# Patient Record
Sex: Female | Born: 1970 | Race: White | Hispanic: No | Smoking: Never smoker
Health system: Southern US, Community
[De-identification: ages and names within clinical notes are randomized; demographics above are authoritative.]

## PROBLEM LIST (undated history)

## (undated) ENCOUNTER — Ambulatory Visit: Admission: EM | Payer: BC Managed Care – PPO

## (undated) DIAGNOSIS — F419 Anxiety disorder, unspecified: Secondary | ICD-10-CM

## (undated) DIAGNOSIS — Z87442 Personal history of urinary calculi: Secondary | ICD-10-CM

## (undated) DIAGNOSIS — Z87440 Personal history of urinary (tract) infections: Secondary | ICD-10-CM

## (undated) DIAGNOSIS — IMO0002 Reserved for concepts with insufficient information to code with codable children: Secondary | ICD-10-CM

## (undated) DIAGNOSIS — M549 Dorsalgia, unspecified: Secondary | ICD-10-CM

## (undated) DIAGNOSIS — C801 Malignant (primary) neoplasm, unspecified: Secondary | ICD-10-CM

## (undated) DIAGNOSIS — M1990 Unspecified osteoarthritis, unspecified site: Secondary | ICD-10-CM

## (undated) DIAGNOSIS — Z8659 Personal history of other mental and behavioral disorders: Secondary | ICD-10-CM

## (undated) DIAGNOSIS — D649 Anemia, unspecified: Secondary | ICD-10-CM

## (undated) DIAGNOSIS — K649 Unspecified hemorrhoids: Secondary | ICD-10-CM

## (undated) HISTORY — DX: Malignant (primary) neoplasm, unspecified: C80.1

## (undated) HISTORY — DX: Unspecified hemorrhoids: K64.9

## (undated) HISTORY — DX: Anemia, unspecified: D64.9

## (undated) HISTORY — DX: Personal history of other mental and behavioral disorders: Z86.59

## (undated) HISTORY — DX: Anxiety disorder, unspecified: F41.9

## (undated) HISTORY — DX: Personal history of urinary (tract) infections: Z87.440

## (undated) HISTORY — DX: Dorsalgia, unspecified: M54.9

## (undated) HISTORY — DX: Unspecified osteoarthritis, unspecified site: M19.90

## (undated) HISTORY — DX: Reserved for concepts with insufficient information to code with codable children: IMO0002

## (undated) HISTORY — DX: Personal history of urinary calculi: Z87.442

---

## 1999-06-06 HISTORY — PX: FOOT SURGERY: SHX648

## 2000-06-05 DIAGNOSIS — C801 Malignant (primary) neoplasm, unspecified: Secondary | ICD-10-CM

## 2000-06-05 HISTORY — DX: Malignant (primary) neoplasm, unspecified: C80.1

## 2011-07-12 ENCOUNTER — Ambulatory Visit: Payer: BC Managed Care – PPO | Admitting: Family Medicine

## 2011-07-12 DIAGNOSIS — R079 Chest pain, unspecified: Secondary | ICD-10-CM

## 2011-07-12 NOTE — Progress Notes (Signed)
  Subjective:    Patient ID: Adrienne Reese, female    DOB: 1971-06-01, 41 y.o.   MRN: [id number]  HPI 41 yo female with c/o chest pain.  Left side, since yesterday.  "moves around".  Some pain in left upper arm.  Today went down into forearm and hand felt weak.  Can't pinpoint when it started yesterday, while at work.  Comes and goes.  No aggravating or relieving factors.  Lasts a few seconds - "maybe 20".  Pain is sharp, "it just hurts", feels like in her ribs.  Never had before.  Recent URI - still with some congestion.  For several weeks.  No n  No family history of heart probs, no personal history of hearth probs.  Good cholesterol.  Generally healthy.    Review of Systems Negative except as per HPI     Objective:   Physical Exam  Constitutional: She appears well-developed and well-nourished.  HENT:  Head: Normocephalic.  Mouth/Throat: Oropharynx is clear and moist.  Cardiovascular: Normal rate, regular rhythm, normal heart sounds and intact distal pulses.   Pulmonary/Chest: Effort normal and breath sounds normal.     EKG - normal     Assessment & Plan:  Atypical chest pain - normal EKG.  No risk factors.  Reassurance.  Monitor.  Red flag symptoms discussed and advised to seek immediate care.

## 2011-12-12 ENCOUNTER — Ambulatory Visit (INDEPENDENT_AMBULATORY_CARE_PROVIDER_SITE_OTHER): Payer: BC Managed Care – PPO | Admitting: Internal Medicine

## 2011-12-12 ENCOUNTER — Ambulatory Visit: Payer: BC Managed Care – PPO

## 2011-12-12 VITALS — BP 122/78 | HR 79 | Temp 98.1°F | Resp 16 | Ht 68.0 in | Wt 166.0 lb

## 2011-12-12 DIAGNOSIS — M25539 Pain in unspecified wrist: Secondary | ICD-10-CM

## 2011-12-12 DIAGNOSIS — M25531 Pain in right wrist: Secondary | ICD-10-CM

## 2011-12-12 DIAGNOSIS — M6740 Ganglion, unspecified site: Secondary | ICD-10-CM

## 2011-12-12 MED ORDER — IBUPROFEN 600 MG PO TABS: 600.0000 mg | ORAL_TABLET | Freq: Three times a day (TID) | ORAL | Status: AC | PRN

## 2011-12-12 NOTE — Progress Notes (Signed)
  Subjective:    Patient ID: Adrienne Reese, female    DOB: Nov 10, 1970, 41 y.o.   MRN: [id number]  HPI Wrist pain for 1-2 months, no injury. Has tender lump over snuff box. Pain with full ext/flex.   Review of Systems healthy    Objective:   Physical Exam R wrist full rom, nmv intact, ganglon over snuff box and tender  [facility] reading (PRIMARY) by  Dr.Teneka Malmberg normal wrist xr.         Assessment & Plan:  Wrist pain cause unclear Ganglion cyst May need ortho referral. Splint,rice, ibuprofen

## 2011-12-12 NOTE — Patient Instructions (Addendum)
Carpal Tunnel Syndrome You may have carpal tunnel syndrome. This is a common condition. Carpal tunnel syndrome occurs when the tendons, bones, or ligaments in the wrist press against the median nerve as it passes into the hand.  Symptoms can include:  Intermittent numbness.   Pain or a tingling sensation in thumb and first two fingers.  The pain may radiate up to the shoulder. There may even be weakness in the hand muscles. The pain is often worse at night and in the early morning. Nerve conduction tests may be used to prove the diagnosis. Carpal tunnel syndrome is most often due to repeated movements of the hand or wrist. Other causes can include:  Prior injuries.   Diabetes.   Obesity.   Smoking.   Pregnancy. Symptoms that develop during pregnancy often stop when the pregnancy is over.  Treatment includes:  Splinting - A wrist splint helps prevent movements that irritate the nerve. Splints are especially helpful at night when the symptoms are often worse.   Ice packs - Cold packs applied to the palm side of the wrist for 20 minutes every 2 hours while awake may give some relief.   Medication - Medicine to reduce inflammation and pain are often used. Cortisone injections around the nerve may also bring improvement.  Severe cases of carpal tunnel syndrome can require surgery to relieve the pressure on the nerve. This may be necessary if there is evidence of weakness or decreased sensation in your hand, or if your symptoms do not improve with conservative treatment. See your caregiver for follow-up to be certain your condition is improving. Document Released: 06/29/2004 Document Revised: 02/01/2011 Document Reviewed: 03/28/2007 [employer] Patient Information 2012 [employer]gaGanglion A ganglion is a swelling under the skin that is filled with a thick, jelly-like substance. It is a synovial cyst. This is caused by a break (rupture) of the joint lining from the joint space. A ganglion  often occurs near an area of repeated minor trauma (damage caused by an accident). Trauma may also be a repetitive movement at work or in a sport. TREATMENT  It often goes away without treatment. It may reappear later. Sometimes a ganglion may need to be surgically removed. Often they are drained and injected with a steroid. Sometimes they respond to:  Rest.   Splinting.  HOME CARE INSTRUCTIONS   Your caregiver will decide the best way of treating your ganglion. Do not try to break the ganglion yourself by pressing on it, poking it with a needle, or hitting it with a heavy object.   Use medications as directed.  SEEK MEDICAL CARE IF:   The ganglion becomes larger or more painful.   You have increased redness or swelling.   You have weakness or numbness in your hand or wrist.  MAKE SURE YOU:   Understand these instructions.   Will watch your condition.   Will get help right away if you are not doing well or get worse.  Document Released: 05/19/2000 Document Revised: 05/11/2011 Document Reviewed: 07/16/2007 [facility] Patient Information 2012 [place], [place].

## 2012-01-28 ENCOUNTER — Ambulatory Visit (INDEPENDENT_AMBULATORY_CARE_PROVIDER_SITE_OTHER): Payer: BC Managed Care – PPO | Admitting: Family Medicine

## 2012-01-28 ENCOUNTER — Ambulatory Visit: Payer: BC Managed Care – PPO

## 2012-01-28 VITALS — BP 127/74 | HR 65 | Temp 98.2°F | Resp 16 | Ht 68.0 in | Wt 169.0 lb

## 2012-01-28 DIAGNOSIS — M542 Cervicalgia: Secondary | ICD-10-CM

## 2012-01-28 DIAGNOSIS — M549 Dorsalgia, unspecified: Secondary | ICD-10-CM

## 2012-01-28 MED ORDER — METHYLPREDNISOLONE 4 MG PO KIT: PACK | ORAL | Status: AC

## 2012-01-28 MED ORDER — MELOXICAM 7.5 MG PO TABS: 7.5000 mg | ORAL_TABLET | Freq: Every day | ORAL | Status: DC
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
## 2012-01-31 ENCOUNTER — Telehealth: Payer: Self-pay

## 2012-01-31 ENCOUNTER — Other Ambulatory Visit: Payer: Self-pay | Admitting: Family Medicine

## 2012-01-31 DIAGNOSIS — M549 Dorsalgia, unspecified: Secondary | ICD-10-CM

## 2012-01-31 MED ORDER — METHOCARBAMOL 500 MG PO TABS: 500.0000 mg | ORAL_TABLET | Freq: Two times a day (BID) | ORAL | Status: AC | PRN

## 2012-01-31 NOTE — Telephone Encounter (Signed)
Spoke with patient will try msk relaxer. If no improvement in 24-48 hrs then will call me and we can refer her out

## 2012-01-31 NOTE — Telephone Encounter (Signed)
The patient called to report that the medrol and mobic are providing little to no relief from the sharp shooting pain she is feeling.  Please call the patient at [phone] to discuss the next steps.

## 2012-01-31 NOTE — Telephone Encounter (Signed)
I have called patient back and she is c/o neck pain into her upper/ mid back usually within a couple days of starting prednisone she is better. She can not turn head or raise arm without pain. Please advise next step and I will call patient

## 2012-02-10 ENCOUNTER — Telehealth: Payer: Self-pay

## 2012-02-10 NOTE — Telephone Encounter (Signed)
Pt states that her back pain is beginning to get worse. Best# [phone] Pharmacy: [facility].

## 2012-02-11 ENCOUNTER — Other Ambulatory Visit: Payer: Self-pay | Admitting: Family Medicine

## 2012-02-11 DIAGNOSIS — M542 Cervicalgia: Secondary | ICD-10-CM

## 2012-02-11 MED ORDER — HYDROCODONE-ACETAMINOPHEN 5-500 MG PO TABS: 1.0000 | ORAL_TABLET | Freq: Three times a day (TID) | ORAL | Status: AC | PRN

## 2012-02-11 MED ORDER — MELOXICAM 7.5 MG PO TABS: 7.5000 mg | ORAL_TABLET | Freq: Two times a day (BID) | ORAL | Status: DC

## 2012-02-11 NOTE — Telephone Encounter (Signed)
SPOKE WITH PATIENT, IF NO IMPROVEMENT WITH MEDICATION CHANGES THEN WILL SEND TO PT

## 2012-07-24 ENCOUNTER — Ambulatory Visit (INDEPENDENT_AMBULATORY_CARE_PROVIDER_SITE_OTHER): Payer: BC Managed Care – PPO | Admitting: Family Medicine

## 2012-07-24 ENCOUNTER — Other Ambulatory Visit: Payer: Self-pay | Admitting: Family Medicine

## 2012-07-24 ENCOUNTER — Encounter: Payer: Self-pay | Admitting: Family Medicine

## 2012-07-24 VITALS — BP 106/68 | HR 71 | Temp 98.5°F | Resp 16 | Ht 67.0 in | Wt 165.6 lb

## 2012-07-24 DIAGNOSIS — R5381 Other malaise: Secondary | ICD-10-CM

## 2012-07-24 DIAGNOSIS — Z0000 Encounter for general adult medical examination without abnormal findings: Secondary | ICD-10-CM

## 2012-07-24 DIAGNOSIS — M5030 Other cervical disc degeneration, unspecified cervical region: Secondary | ICD-10-CM

## 2012-07-24 DIAGNOSIS — R5383 Other fatigue: Secondary | ICD-10-CM

## 2012-07-24 DIAGNOSIS — Z803 Family history of malignant neoplasm of breast: Secondary | ICD-10-CM

## 2012-07-24 LAB — CBC WITH DIFFERENTIAL/PLATELET
Basophils Absolute: 0 10*3/uL (ref 0.0–0.1)
Basophils Relative: 0 % (ref 0–1)
Eosinophils Absolute: 0.1 10*3/uL (ref 0.0–0.7)
HCT: 40.9 % (ref 36.0–46.0)
MCH: 29.9 pg (ref 26.0–34.0)
MCHC: 35 g/dL (ref 30.0–36.0)
Monocytes Absolute: 0.5 10*3/uL (ref 0.1–1.0)
Monocytes Relative: 7 % (ref 3–12)
Neutro Abs: 3.6 10*3/uL (ref 1.7–7.7)
RDW: 13 % (ref 11.5–15.5)

## 2012-07-24 LAB — COMPREHENSIVE METABOLIC PANEL
AST: 16 U/L (ref 0–37)
Albumin: 4.7 g/dL (ref 3.5–5.2)
Alkaline Phosphatase: 67 U/L (ref 39–117)
BUN: 11 mg/dL (ref 6–23)
Creat: 0.82 mg/dL (ref 0.50–1.10)
Glucose, Bld: 99 mg/dL (ref 70–99)
Potassium: 3.9 mEq/L (ref 3.5–5.3)
Total Bilirubin: 0.7 mg/dL (ref 0.3–1.2)

## 2012-07-24 NOTE — Progress Notes (Signed)
  Subjective:    Patient ID: Adrienne Reese, female    DOB: 02-04-1971, 42 y.o.   MRN: [id number]  HPI  This 42 y.o. Cauc female is here for CPE (GYN care provided elsewhere). She was evaluated   at [facility] several months ago for neck pain w/ radicular symptoms (see note per Dr. Conley Rolls dated  01/26/12- past hx well documented). CHronic pain now w/ no relief w/ conservative treatment  measures. Pt desires further evaluation.   Review of Systems  Constitutional: Positive for diaphoresis and unexpected weight change.  HENT: Positive for neck pain and neck stiffness.   Eyes: Negative.   Respiratory: Negative.   Cardiovascular: Negative.   Gastrointestinal: Negative.   Endocrine: Negative.   Genitourinary: Positive for menstrual problem.  Musculoskeletal: Positive for back pain.  Skin: Negative.   Allergic/Immunologic: Negative.   Neurological: Negative.   Hematological: Negative.   Psychiatric/Behavioral: Negative.        Objective:   Physical Exam  Nursing note and vitals reviewed. Constitutional: She is oriented to person, place, and time. Vital signs are normal. She appears well-developed and well-nourished. No distress.  HENT:  Head: Normocephalic and atraumatic.  Right Ear: Hearing, tympanic membrane, external ear and ear canal normal.  Left Ear: Hearing, tympanic membrane, external ear and ear canal normal.  Nose: Nose normal. No nasal deformity or septal deviation.  Mouth/Throat: Uvula is midline, oropharynx is clear and moist and mucous membranes are normal. No oral lesions. Normal dentition. No dental caries.  Eyes: Conjunctivae, EOM and lids are normal. Pupils are equal, round, and reactive to light. No scleral icterus.  Fundoscopic exam:      The right eye shows no arteriolar narrowing, no AV nicking and no papilledema. The right eye shows red reflex.       The left eye shows no arteriolar narrowing, no AV nicking and no papilledema. The left eye shows red reflex.  Neck:  Trachea normal. Neck supple. Muscular tenderness present. No spinous process tenderness present. No rigidity. No mass and no thyromegaly present.  Cardiovascular: Normal rate, regular rhythm, normal heart sounds and intact distal pulses.  Exam reveals no gallop and no friction rub.   No murmur heard. Pulmonary/Chest: Effort normal and breath sounds normal. No respiratory distress. She exhibits no tenderness.  Abdominal: Soft. Normal appearance and bowel sounds are normal. She exhibits no distension and no mass. There is no hepatosplenomegaly. There is no tenderness. There is no guarding and no CVA tenderness.  Genitourinary:  Exam deferred- performed by GYN.  Musculoskeletal: Normal range of motion. She exhibits no edema and no tenderness.  Lymphadenopathy:    She has no cervical adenopathy.  Neurological: She is alert and oriented to person, place, and time. She has normal reflexes. She displays normal reflexes. No cranial nerve deficit. She exhibits normal muscle tone. Coordination normal.  Skin: Skin is warm and dry. No rash noted. No erythema. No pallor.  Psychiatric: She has a normal mood and affect. Her behavior is normal. Judgment and thought content normal.          Assessment & Plan:  Routine general medical examination at a health care facility- overall general health is good.  DDD (degenerative disc disease), cervical - Plan: MR Cervical Spine Wo Contrast  Other malaise and fatigue - Plan: Vitamin D, 25-hydroxy, CBC with Differential, Comprehensive metabolic panel, TSH  Family history of breast cancer in mother - Plan: MM Digital Screening

## 2012-07-24 NOTE — Patient Instructions (Addendum)
Keeping You Healthy  Get These Tests 1. Blood Pressure- Have your blood pressure checked once a year by your health care provider.  Normal blood pressure is 120/80. 2. Weight- Have your body mass index (BMI) calculated to screen for obesity.  BMI is measure of body fat based on height and weight.  You can also calculate your own BMI at https://www.west-esparza.com/. 3. Cholesterol- Have your cholesterol checked every 5 years starting at age 40 then yearly starting at age 59. 4. Chlamydia, HIV, and other sexually transmitted diseases- Get screened every year until age 29, then within three months of each new sexual provider. 5. Pap Smear- Every 1-3 years; discuss with your health care provider. 6. Mammogram- Every year starting at age 43  Take these medicines  Calcium with Vitamin D-Your body needs 1200 mg of Calcium each day and [phone] IU of Vitamin D daily.  Your body can only absorb 500 mg of Calcium at a time so Calcium must be taken in 2 or 3 divided doses throughout the day.  Multivitamin with folic acid- Once daily if it is possible for you to become pregnant.  Get these Immunizations  Gardasil-Series of three doses; prevents HPV related illness such as genital warts and cervical cancer.  Menactra-Single dose; prevents meningitis.  Tetanus shot- Every 10 years.  You had Tdap in April 2012. Next Tetanus due in 2022.  Flu shot-Every year.  Take these steps 1. Do not smoke-Your healthcare provider can help you quit.  For tips on how to quit go to www.smokefree.gov or call 1-800 QUITNOW. 2. Be physically active- Exercise 5 days a week for at least 30 minutes.  If you are not already physically active, start slow and gradually work up to 30 minutes of moderate physical activity.  Examples of moderate activity include walking briskly, dancing, swimming, bicycling, etc. 3. Breast Cancer- A self breast exam every month is important for early detection of breast cancer.  For more information  and instruction on self breast exams, ask your healthcare provider or [place].[employer]. 4. Eat a healthy diet- Eat a variety of healthy foods such as fruits, vegetables, whole grains, low fat milk, low fat cheeses, yogurt, lean meats, poultry and fish, beans, nuts, tofu, etc.  For more information go to www. Thenutritionsource.org 5. Drink alcohol in moderation- Limit alcohol intake to one drink or less per day. Never drink and drive. 6. Depression- Your emotional health is as important as your physical health.  If you're feeling down or losing interest in things you normally enjoy please talk to your healthcare provider about being screened for depression. 7. Dental visit- Brush and floss your teeth twice daily; visit your dentist twice a year. 8. Eye doctor- Get an eye exam at least every 2 years. 9. Helmet use- Always wear a helmet when riding a bicycle, motorcycle, rollerblading or skateboarding. 10. Safe sex- If you may be exposed to sexually transmitted infections, use a condom. 11. Seat belts- Seat belts can save your live; always wear one. 12. Smoke/Carbon Monoxide detectors- These detectors need to be installed on the appropriate level of your home. Replace batteries at least once a year. 13. Skin cancer- When out in the sun please cover up and use sunscreen 15 SPF or higher. 14. Violence- If anyone is threatening or hurting you, please tell your healthcare provider.    Degenerative Disk Disease Degenerative disk disease is a condition caused by the changes that occur in the cushions of the backbone (spinal disks) as you  grow older. Spinal disks are soft and compressible disks located between the bones of the spine (vertebrae). They act like shock absorbers. Degenerative disk disease can affect the whole spine. However, the neck and lower back are most commonly affected. Many changes can occur in the spinal disks with aging, such as:  The spinal disks may dry  and shrink.  Small tears may occur in the tough, outer covering of the disk (annulus).  The disk space may become smaller due to loss of water.  Abnormal growths in the bone (spurs) may occur. This can put pressure on the nerve roots exiting the spinal canal, causing pain.  The spinal canal may become narrowed. CAUSES  Degenerative disk disease is a condition caused by the changes that occur in the spinal disks with aging. The exact cause is not known, but there is a genetic basis for many patients. Degenerative changes can occur due to loss of fluid in the disk. This makes the disk thinner and reduces the space between the backbones. Small cracks can develop in the outer layer of the disk. This can lead to the breakdown of the disk. You are more likely to get degenerative disk disease if you are overweight. Smoking cigarettes and doing heavy work such as weightlifting can also increase your risk of this condition. Degenerative changes can start after a sudden injury. Growth of bone spurs can compress the nerve roots and cause pain.  SYMPTOMS  The symptoms vary from person to person. Some people may have no pain, while others have severe pain. The pain may be so severe that it can limit your activities. The location of the pain depends on the part of your backbone that is affected. You will have neck or arm pain if a disk in the neck area is affected. You will have pain in your back, buttocks, or legs if a disk in the lower back is affected. The pain becomes worse while bending, reaching up, or with twisting movements. The pain may start gradually and then get worse as time passes. It may also start after a major or minor injury. You may feel numbness or tingling in the arms or legs.  DIAGNOSIS  Your caregiver will ask you about your symptoms and about activities or habits that may cause the pain. He or she may also ask about any injuries, diseases, or treatments you have had earlier. Your caregiver  will examine you to check for the range of movement that is possible in the affected area, to check for strength in your extremities, and to check for sensation in the areas of the arms and legs supplied by different nerve roots. An X-ray of the spine may be taken. Your caregiver may suggest other imaging tests, such as magnetic resonance imaging (MRI), if needed.  TREATMENT  Treatment includes rest, modifying your activities, and applying ice and heat. Your caregiver may prescribe medicines to reduce your pain and may ask you to do some exercises to strengthen your back. In some cases, you may need surgery. You and your caregiver will decide on the treatment that is best for you. HOME CARE INSTRUCTIONS   Follow proper lifting and walking techniques as advised by your caregiver.  Maintain good posture.  Exercise regularly as advised.  Perform relaxation exercises.  Change your sitting, standing, and sleeping habits as advised. Change positions frequently.  Lose weight as advised.  Stop smoking if you smoke.  Wear supportive footwear. SEEK MEDICAL CARE IF:  Your pain does  not go away within 1 to 4 weeks. SEEK IMMEDIATE MEDICAL CARE IF:   Your pain is severe.  You notice weakness in your arms, hands, or legs.  You begin to lose control of your bladder or bowel movements. MAKE SURE YOU:   Understand these instructions.  Will watch your condition.  Will get help right away if you are not doing well or get worse. Document Released: 03/19/2007 Document Revised: 08/14/2011 Document Reviewed: 03/19/2007 [facility] Patient Information 2013 [place], [place].

## 2012-07-25 LAB — VITAMIN D 25 HYDROXY (VIT D DEFICIENCY, FRACTURES): Vit D, 25-Hydroxy: 18 ng/mL — ABNORMAL LOW (ref 30–89)

## 2012-07-26 ENCOUNTER — Other Ambulatory Visit: Payer: Self-pay | Admitting: Family Medicine

## 2012-07-26 DIAGNOSIS — M5030 Other cervical disc degeneration, unspecified cervical region: Secondary | ICD-10-CM | POA: Insufficient documentation

## 2012-07-26 LAB — LIPID PANEL
Cholesterol: 168 mg/dL (ref 0–200)
HDL: 40 mg/dL (ref >39)
LDL Cholesterol: 97 mg/dL (ref 0–99)
Total CHOL/HDL Ratio: 4.2 Ratio
Triglycerides: 153 mg/dL — ABNORMAL HIGH (ref <150)
VLDL: 31 mg/dL (ref 0–40)

## 2012-07-26 MED ORDER — ERGOCALCIFEROL 1.25 MG (50000 UT) PO CAPS: 50000.0000 [IU] | ORAL_CAPSULE | ORAL | Status: DC

## 2012-07-26 NOTE — Progress Notes (Signed)
Quick Note:  Please call pt and advise that the following labs are abnormal... All labs are normal except Vitamin D level is low; I am prescribing a supplement for you to pick up from your pharmacy. It is 50000 units per capsule, to be taken once a week for several months.  Also need to get some sun exposure most days of the week and try to eat more salmon, tuna or cod as well as mushrooms (if you like them) and some dairy products.  Copy to pt. ______

## 2012-07-27 NOTE — Progress Notes (Signed)
Quick Note:  Please contact pt and advise that lipids are normal. ______

## 2012-08-05 ENCOUNTER — Other Ambulatory Visit: Payer: Self-pay

## 2012-10-09 ENCOUNTER — Telehealth: Payer: Self-pay

## 2012-10-09 DIAGNOSIS — E559 Vitamin D deficiency, unspecified: Secondary | ICD-10-CM

## 2012-10-09 NOTE — Telephone Encounter (Signed)
Please advise 

## 2012-10-09 NOTE — Telephone Encounter (Signed)
Pt needs to know when she should come in for a re-check of her vitamin D levels. [phone]

## 2012-10-09 NOTE — Telephone Encounter (Signed)
Recheck in 8 weeks.

## 2012-10-09 NOTE — Telephone Encounter (Signed)
Patient advised order placed for this.

## 2012-12-17 ENCOUNTER — Telehealth: Payer: Self-pay

## 2012-12-17 NOTE — Telephone Encounter (Signed)
[facility] called requesting the order for the MRI that Dr. Audria Nine wanted done for the patient.  Please fax to: Fax [phone]  Call for more information at: [phone]

## 2012-12-18 NOTE — Telephone Encounter (Signed)
Will you print the order sign and fax?

## 2012-12-19 ENCOUNTER — Telehealth: Payer: Self-pay | Admitting: *Deleted

## 2012-12-19 ENCOUNTER — Other Ambulatory Visit: Payer: Self-pay | Admitting: Family Medicine

## 2012-12-19 DIAGNOSIS — M5030 Other cervical disc degeneration, unspecified cervical region: Secondary | ICD-10-CM

## 2012-12-19 NOTE — Telephone Encounter (Signed)
I ordered the C-spine MRI and printed it; it will be faxed by staff member at [facility].to [facility] at [phone].

## 2012-12-19 NOTE — Telephone Encounter (Signed)
Faxed order to [facility] for MR Cervical without Contrast at 1:34 p. M., per Dr Audria Nine.

## 2012-12-19 NOTE — Telephone Encounter (Signed)
Confirmation page received at 1:56 p m on the fax to [facility].

## 2012-12-19 NOTE — Progress Notes (Signed)
MR Cervical Spine w/o Contrast Order printed out, signed and will be faxed to [facility].

## 2012-12-20 ENCOUNTER — Telehealth: Payer: Self-pay

## 2012-12-20 DIAGNOSIS — M5030 Other cervical disc degeneration, unspecified cervical region: Secondary | ICD-10-CM

## 2012-12-20 NOTE — Telephone Encounter (Signed)
Do not have this yet.

## 2012-12-20 NOTE — Telephone Encounter (Signed)
Patient is calling to get MRI results. Patient states she had her MRI done earlier this week at an office that we referred her to.  [phone]

## 2012-12-20 NOTE — Telephone Encounter (Signed)
We should check on this and see where it was done, so we can get report. Can someone do this on Monday? I am off

## 2012-12-23 NOTE — Telephone Encounter (Signed)
I reviewed MRI of C-spine and called pt to discuss results (to be scanned into record). Neck pain is a chronic problem (12 years) and she has had PT and injections w/o resolution of problem. Since MRI on 12/17/12, she has L arm paresthesias which is new symptom. I discussed Neurosurgical evaluation with pt; she understands and agrees.

## 2012-12-23 NOTE — Telephone Encounter (Signed)
I called [facility] to obtain report. Toni Amend will be faxing report over. Barbarann Ehlers

## 2013-01-14 ENCOUNTER — Encounter: Payer: Self-pay | Admitting: Family Medicine

## 2013-02-03 HISTORY — PX: CERVICAL SPINE SURGERY: SHX589

## 2013-02-05 ENCOUNTER — Ambulatory Visit (INDEPENDENT_AMBULATORY_CARE_PROVIDER_SITE_OTHER): Payer: BC Managed Care – PPO | Admitting: Physician Assistant

## 2013-02-05 VITALS — BP 110/76 | HR 78 | Temp 97.7°F | Resp 20 | Ht 67.0 in | Wt 161.8 lb

## 2013-02-05 DIAGNOSIS — N390 Urinary tract infection, site not specified: Secondary | ICD-10-CM

## 2013-02-05 LAB — POCT UA - MICROSCOPIC ONLY
Crystals, Ur, HPF, POC: NEGATIVE
Mucus, UA: NEGATIVE
Yeast, UA: NEGATIVE

## 2013-02-05 LAB — POCT URINALYSIS DIPSTICK
Bilirubin, UA: NEGATIVE
Ketones, UA: NEGATIVE
Protein, UA: NEGATIVE
pH, UA: 7

## 2013-02-05 MED ORDER — NITROFURANTOIN MONOHYD MACRO 100 MG PO CAPS: 100.0000 mg | ORAL_CAPSULE | Freq: Two times a day (BID) | ORAL | Status: DC

## 2013-02-05 NOTE — Progress Notes (Signed)
Subjective:    Patient ID: Adrienne Reese, female    DOB: May 26, 1971, 42 y.o.   MRN: [id number]  HPI   Adrienne Reese is a very pleasant 42 yr old female here because "I'm pretty sure I have a UTI."  Woke in the middle of the night last night with urinary symptoms.  Experiencing pain with urination, pressure, strong odor to urine.  Denies hematuria, abd pain, flank pain, NV, FC.  Has had numerous UTIs in the past - last about 1 yr ago.  Actually considered daily antibiotic prophylaxis at one time due to frequency of infections, but they seemed to improve and she has been able to avoid that.  Denies vaginal symptoms.  LMP 01/22/13, regular every month.     Review of Systems  Constitutional: Negative for fever and chills.  HENT: Negative.   Respiratory: Negative.   Cardiovascular: Negative.   Gastrointestinal: Negative.   Genitourinary: Positive for dysuria. Negative for urgency, frequency, hematuria, flank pain and vaginal discharge.  Musculoskeletal: Negative.   Skin: Negative.   Neurological: Negative.        Objective:   Physical Exam  Vitals reviewed. Constitutional: She is oriented to person, place, and time. She appears well-developed and well-nourished. No distress.  HENT:  Head: Normocephalic and atraumatic.  Eyes: Conjunctivae are normal. No scleral icterus.  Cardiovascular: Normal rate, regular rhythm and normal heart sounds.   Pulmonary/Chest: Effort normal and breath sounds normal. She has no wheezes. She has no rales.  Abdominal: Soft. Bowel sounds are normal. There is tenderness in the right lower quadrant and suprapubic area. There is no rigidity, no rebound, no guarding, no CVA tenderness and no tenderness at McBurney's point.  Neurological: She is alert and oriented to person, place, and time.  Skin: Skin is warm and dry.  Psychiatric: She has a normal mood and affect. Her behavior is normal.    Results for orders placed in visit on 02/05/13  POCT UA - MICROSCOPIC  ONLY      Result Value Range   WBC, Ur, HPF, POC 8-13     RBC, urine, microscopic 0-1     Bacteria, U Microscopic 1+     Mucus, UA neg     Epithelial cells, urine per micros 0-1     Crystals, Ur, HPF, POC neg     Casts, Ur, LPF, POC neg     Yeast, UA neg    POCT URINALYSIS DIPSTICK      Result Value Range   Color, UA yellow     Clarity, UA sl cloudy     Glucose, UA neg     Bilirubin, UA neg     Ketones, UA neg     Spec Grav, UA 1.010     Blood, UA small     pH, UA 7.0     Protein, UA neg     Urobilinogen, UA 0.2     Nitrite, UA neg     Leukocytes, UA small (1+)          Assessment & Plan:  UTI (urinary tract infection) - Plan: POCT UA - Microscopic Only, POCT urinalysis dipstick, Urine culture, nitrofurantoin, macrocrystal-monohydrate, (MACROBID) 100 MG capsule   Adrienne Reese is a very pleasant 42 yr old female with likely UTI.  UA is not overly impressive, but symptoms began only a few hours ago.  Pt has had numerous UTIs in the past and this feels similar to previous episodes.  Will start treatment with Macrobid today.  Urine cx sent.  Push fluids.  OTC Azo for sxs if needed.  RTC if worsening or not improving.

## 2013-02-05 NOTE — Patient Instructions (Addendum)
Begin taking the antibiotic as directed today.  Be sure to finish the full course (unless you hear otherwise from me).  Drink plenty of fluids (water is best!)  I will let you know when your culture is back and if we need to make any changes at that time.  Please let me know if any symptoms are worsening or not improving.   Urinary Tract Infection Urinary tract infections (UTIs) can develop anywhere along your urinary tract. Your urinary tract is your body's drainage system for removing wastes and extra water. Your urinary tract includes two kidneys, two ureters, a bladder, and a urethra. Your kidneys are a pair of bean-shaped organs. Each kidney is about the size of your fist. They are located below your ribs, one on each side of your spine. CAUSES Infections are caused by microbes, which are microscopic organisms, including fungi, viruses, and bacteria. These organisms are so small that they can only be seen through a microscope. Bacteria are the microbes that most commonly cause UTIs. SYMPTOMS  Symptoms of UTIs may vary by age and gender of the patient and by the location of the infection. Symptoms in young women typically include a frequent and intense urge to urinate and a painful, burning feeling in the bladder or urethra during urination. Older women and men are more likely to be tired, shaky, and weak and have muscle aches and abdominal pain. A fever may mean the infection is in your kidneys. Other symptoms of a kidney infection include pain in your back or sides below the ribs, nausea, and vomiting. DIAGNOSIS To diagnose a UTI, your caregiver will ask you about your symptoms. Your caregiver also will ask to provide a urine sample. The urine sample will be tested for bacteria and white blood cells. White blood cells are made by your body to help fight infection. TREATMENT  Typically, UTIs can be treated with medication. Because most UTIs are caused by a bacterial infection, they usually can be  treated with the use of antibiotics. The choice of antibiotic and length of treatment depend on your symptoms and the type of bacteria causing your infection. HOME CARE INSTRUCTIONS  If you were prescribed antibiotics, take them exactly as your caregiver instructs you. Finish the medication even if you feel better after you have only taken some of the medication.  Drink enough water and fluids to keep your urine clear or pale yellow.  Avoid caffeine, tea, and carbonated beverages. They tend to irritate your bladder.  Empty your bladder often. Avoid holding urine for long periods of time.  Empty your bladder before and after sexual intercourse.  After a bowel movement, women should cleanse from front to back. Use each tissue only once. SEEK MEDICAL CARE IF:   You have back pain.  You develop a fever.  Your symptoms do not begin to resolve within 3 days. SEEK IMMEDIATE MEDICAL CARE IF:   You have severe back pain or lower abdominal pain.  You develop chills.  You have nausea or vomiting.  You have continued burning or discomfort with urination. MAKE SURE YOU:   Understand these instructions.  Will watch your condition.  Will get help right away if you are not doing well or get worse. Document Released: 03/01/2005 Document Revised: 11/21/2011 Document Reviewed: 06/30/2011 [facility] Patient Information 2014 [place], [place].

## 2013-02-07 LAB — URINE CULTURE

## 2013-02-17 ENCOUNTER — Encounter: Payer: Self-pay | Admitting: Family Medicine

## 2013-02-17 ENCOUNTER — Ambulatory Visit (INDEPENDENT_AMBULATORY_CARE_PROVIDER_SITE_OTHER): Payer: BC Managed Care – PPO | Admitting: Family Medicine

## 2013-02-17 VITALS — BP 108/68 | HR 69 | Temp 98.5°F | Ht 67.75 in | Wt 161.5 lb

## 2013-02-17 DIAGNOSIS — Z23 Encounter for immunization: Secondary | ICD-10-CM

## 2013-02-17 DIAGNOSIS — M4802 Spinal stenosis, cervical region: Secondary | ICD-10-CM | POA: Insufficient documentation

## 2013-02-17 DIAGNOSIS — E559 Vitamin D deficiency, unspecified: Secondary | ICD-10-CM | POA: Insufficient documentation

## 2013-02-17 DIAGNOSIS — R300 Dysuria: Secondary | ICD-10-CM

## 2013-02-17 LAB — POCT URINALYSIS DIPSTICK
Glucose, UA: NEGATIVE
Leukocytes, UA: NEGATIVE
Nitrite, UA: NEGATIVE
Urobilinogen, UA: NEGATIVE

## 2013-02-17 NOTE — Patient Instructions (Addendum)
Urinalysis today  Stick with water for the next 1-2 weeks to prevent urinary symptoms  Lab for D level today  Flu shot today  Take care of yourself

## 2013-02-17 NOTE — Progress Notes (Signed)
Subjective:    Patient ID: Adrienne Reese, female    DOB: 07-Apr-1971, 42 y.o.   MRN: [id number]  HPI Here to est  Prev went to [facility] urgent care   Went to [facility] last week for uti -- was on macrobid for 5 d  Last night had symptoms again - gets pressure and urge to strain to get urine out with some pain (no odor to urine) Fine today  She did drink some diet green tea- and OJ - may have caused it  She has had quite a few utis in the past (not a lot lately however)  Has gyn- Dr Carlyon Prows  G5P5 Pap last was 2012 Had the esure process for birth control   Started having mammograms at 42 years old    Td 2012 Needs a flu shot today - has had the flu shot once   fam hx of breast ca -mother fam hx of melanoma -mother  Melanoma in grandmother also  Personal hx of melanoma and basal cell skin cancer  The melanoma - excised it and she did not have to have chemo or radiation   Hx of D def Level 18 in Feb 2014 50.000 iu weekly for several months -- no OTC D right now  MGM has OP   Stay at home mom  [facility]will have that done in [place]  She has spinal stenosis (12 years) with disc dz and cysts and bone spurs Pain med has not helped  Had PT  Had epidural injections twice with very brief relief  Constant pain and this has limited her ADLs  Recently lower back pain also   She has gained wt lately also - but limited exercise - so she worked on weight loss (lost 10 lb with better diet)  Hx of depression and anxiety in the past  This stemmed from grief loosing her step dad in 2007   Patient Active Problem List   Diagnosis Date Noted  . Unspecified vitamin D deficiency 02/17/2013  . Spinal stenosis in cervical region 02/17/2013  . Dysuria 02/17/2013  . DDD (degenerative disc disease), cervical 07/26/2012  . Family history of breast cancer in mother 07/24/2012   Past Medical History  Diagnosis Date  . Anxiety    . Cancer 2002    melanoma/basal cell  . H/O bladder infections   . Back pain   . Hemorrhoids   . Arthritis   . History of depression   . History of kidney stones    Past Surgical History  Procedure Laterality Date  . Cesarean section  2005  . Foot surgery  2001   History  Substance Use Topics  . Smoking status: Never Smoker   . Smokeless tobacco: Not on file  . Alcohol Use: Yes     Comment: drink 1-2 glasses of wine twice a year   Family History  Problem Relation Age of Onset  . Positive PPD/TB Exposure Neg Hx   . Cancer Mother     breast - left and melanoma  . Hypertension Brother   . Hyperlipidemia Brother   . Cancer Maternal Grandmother     colon and melanoma  . Alcohol abuse Maternal Aunt   . Alcohol abuse Maternal Grandfather   . Arthritis Maternal Grandmother   . Breast cancer Mother   . Hyperlipidemia Mother   . Hyperlipidemia Maternal Grandmother   . Hypertension Maternal Grandmother   . Heart  disease Maternal Grandmother   . Depression Mother   . Depression Father    Allergies  Allergen Reactions  . Clarithromycin Nausea And Vomiting  . Ranitidine Hcl Hives and Swelling  . Sulfa Antibiotics Hives   No current outpatient prescriptions on file prior to visit.   No current facility-administered medications on file prior to visit.    Review of Systems Review of Systems  Constitutional: Negative for fever, appetite change, fatigue and unexpected weight change.  Eyes: Negative for pain and visual disturbance.  Respiratory: Negative for cough and shortness of breath.   Cardiovascular: Negative for cp or palpitations    Gastrointestinal: Negative for nausea, diarrhea and constipation.  Genitourinary: Negative for urgency and frequency. (today is better) Skin: Negative for pallor or rash   MSK pos for chronic neck pain / also occ low back pain  Neurological: Negative for weakness, light-headedness, numbness and headaches.  Hematological: Negative for  adenopathy. Does not bruise/bleed easily.  Psychiatric/Behavioral: Negative for dysphoric mood. The patient is not nervous/anxious.         Objective:   Physical Exam  Constitutional: She appears well-developed and well-nourished. No distress.  HENT:  Head: Normocephalic and atraumatic.  Right Ear: External ear normal.  Left Ear: External ear normal.  Nose: Nose normal.  Mouth/Throat: Oropharynx is clear and moist.  Eyes: Conjunctivae and EOM are normal. Pupils are equal, round, and reactive to light. Right eye exhibits no discharge. Left eye exhibits no discharge. No scleral icterus.  Neck: Neck supple. No JVD present. Carotid bruit is not present. No thyromegaly present.  Limited flex/ ext of neck due to chronic pain   Cardiovascular: Normal rate, regular rhythm, normal heart sounds and intact distal pulses.  Exam reveals no gallop.   Pulmonary/Chest: Effort normal and breath sounds normal. No respiratory distress. She has no wheezes. She has no rales.  Abdominal: Soft. Bowel sounds are normal. She exhibits no distension and no abdominal bruit. There is no tenderness. There is no rebound and no guarding.  Musculoskeletal: She exhibits no edema and no tenderness.  No LS tenderness  Lymphadenopathy:    She has no cervical adenopathy.  Neurological: She is alert. She has normal reflexes. No cranial nerve deficit. She exhibits normal muscle tone. Coordination normal.  Skin: Skin is warm and dry. No rash noted. No erythema. No pallor.  Psychiatric: She has a normal mood and affect.          Assessment & Plan:

## 2013-02-18 LAB — VITAMIN D 25 HYDROXY (VIT D DEFICIENCY, FRACTURES): Vit D, 25-Hydroxy: 34 ng/mL (ref 30–89)

## 2013-02-18 NOTE — Assessment & Plan Note (Signed)
Lab today Completed high dose tx from her gyn  Will adv re: what to take next

## 2013-02-18 NOTE — Assessment & Plan Note (Signed)
[facility] [place] [facility]

## 2013-02-18 NOTE — Assessment & Plan Note (Signed)
Neg ua  Adv to drink more water and avoid spice/ citrus/ caff and art sweetener Is better today

## 2013-02-19 ENCOUNTER — Telehealth: Payer: Self-pay | Admitting: Radiology

## 2013-02-19 NOTE — Telephone Encounter (Signed)
Patient has requested last visit for UTI sent to laser center, this is done.

## 2013-03-14 ENCOUNTER — Ambulatory Visit (INDEPENDENT_AMBULATORY_CARE_PROVIDER_SITE_OTHER): Payer: BC Managed Care – PPO | Admitting: Family Medicine

## 2013-03-14 ENCOUNTER — Encounter: Payer: Self-pay | Admitting: Family Medicine

## 2013-03-14 VITALS — BP 108/66 | HR 82 | Temp 98.0°F | Ht 67.75 in | Wt 160.2 lb

## 2013-03-14 DIAGNOSIS — R238 Other skin changes: Secondary | ICD-10-CM

## 2013-03-14 DIAGNOSIS — L988 Other specified disorders of the skin and subcutaneous tissue: Secondary | ICD-10-CM

## 2013-03-14 DIAGNOSIS — Z9889 Other specified postprocedural states: Secondary | ICD-10-CM

## 2013-03-14 MED ORDER — MUPIROCIN 2 % EX OINT: TOPICAL_OINTMENT | Freq: Two times a day (BID) | CUTANEOUS | Status: DC

## 2013-03-14 NOTE — Progress Notes (Signed)
Subjective:    Patient ID: Adrienne Reese, female    DOB: [birth year], 42 y.o.   MRN: [id number]  HPI Here for incision check Has laser cervical spine surgery  Date was 9/27 Recovery is overall going pretty well  A little muscle spasm today- going down back - has been lifting her son   Thinks she is healing  Steri strips are still there  Changes bandage every 4 days   Itching a bit  Also has a red spot on her arm left Was a white head  Now looks better today Not draining   Patient Active Problem List   Diagnosis Date Noted  . Papule 03/14/2013  . Unspecified vitamin D deficiency 02/17/2013  . Spinal stenosis in cervical region 02/17/2013  . Dysuria 02/17/2013  . DDD (degenerative disc disease), cervical 07/26/2012  . Family history of breast cancer in mother 07/24/2012   Past Medical History  Diagnosis Date  . Anxiety   . Cancer 2002    melanoma/basal cell  . H/O bladder infections   . Back pain   . Hemorrhoids   . Arthritis   . History of depression   . History of kidney stones    Past Surgical History  Procedure Laterality Date  . Cesarean section  2005  . Foot surgery  2001   History  Substance Use Topics  . Smoking status: Never Smoker   . Smokeless tobacco: Not on file  . Alcohol Use: Yes     Comment: drink 1-2 glasses of wine twice a year   Family History  Problem Relation Age of Onset  . Positive PPD/TB Exposure Neg Hx   . Cancer Mother     breast - left and melanoma  . Hypertension Brother   . Hyperlipidemia Brother   . Cancer Maternal Grandmother     colon and melanoma  . Alcohol abuse Maternal Aunt   . Alcohol abuse Maternal Grandfather   . Arthritis Maternal Grandmother   . Breast cancer Mother   . Hyperlipidemia Mother   . Hyperlipidemia Maternal Grandmother   . Hypertension Maternal Grandmother   . Heart disease Maternal Grandmother   . Depression Mother   . Depression Father    Allergies  Allergen Reactions  . Clarithromycin  Nausea And Vomiting  . Ranitidine Hcl Hives and Swelling  . Sulfa Antibiotics Hives   No current outpatient prescriptions on file prior to visit.   No current facility-administered medications on file prior to visit.    Review of Systems Review of Systems  Constitutional: Negative for fever, appetite change, fatigue and unexpected weight change.  Eyes: Negative for pain and visual disturbance.  Respiratory: Negative for cough and shortness of breath.   Cardiovascular: Negative for cp or palpitations    Gastrointestinal: Negative for nausea, diarrhea and constipation.  Genitourinary: Negative for urgency and frequency.  Skin: Negative for pallor or rash   MSK pos for neck pain that has improved since her surgery Neurological: Negative for weakness, light-headedness, numbness and headaches.  Hematological: Negative for adenopathy. Does not bruise/bleed easily.  Psychiatric/Behavioral: Negative for dysphoric mood. The patient is not nervous/anxious.          Objective:   Physical Exam  Constitutional: She appears well-developed and well-nourished. No distress.  HENT:  Head: Normocephalic and atraumatic.  Eyes: Conjunctivae and EOM are normal. Pupils are equal, round, and reactive to light.  Neck: Neck supple. No JVD present. No tracheal deviation present.  Cardiovascular: Normal rate and regular rhythm.  Pulmonary/Chest: Effort normal and breath sounds normal.  Lymphadenopathy:    She has no cervical adenopathy.  Neurological: She is alert. She exhibits normal muscle tone.  Skin: Skin is warm and dry. No rash noted. No pallor.  Incision on back of the neck is well healing with steri strips and no redness/ drainage or swelling  slt tender   L forearm- 2-3 mm papule with erythema and no drainage    Psychiatric: She has a normal mood and affect.          Assessment & Plan:

## 2013-03-14 NOTE — Patient Instructions (Signed)
Keep the papule on arm clean with antibacterial soap and water  Apply bactroban ointment twice daily  If worse or not improved after a week- let me know   Neck incision looks good Follow surgery instructions for dressing  If any redness or swelling let me know

## 2013-03-16 DIAGNOSIS — Z9889 Other specified postprocedural states: Secondary | ICD-10-CM | POA: Insufficient documentation

## 2013-03-16 NOTE — Assessment & Plan Note (Signed)
[place]- [place] procedure Re checked incision today- and this looked very good  Re dressed with telfa and  Paper tape  Disc wound care  Will update if any problems and keep in contact with surgeon

## 2013-03-16 NOTE — Assessment & Plan Note (Signed)
On L arm  Suspect insect bite or less likely ingrown hair  In light or recent surgery-need to watch out for mrsa Inst: wound care- see AVS bactroban oint bid Update if not starting to improve in a week or if worsening

## 2013-05-12 ENCOUNTER — Ambulatory Visit: Payer: BC Managed Care – PPO | Admitting: Family Medicine

## 2013-06-29 ENCOUNTER — Ambulatory Visit (INDEPENDENT_AMBULATORY_CARE_PROVIDER_SITE_OTHER): Payer: BC Managed Care – PPO | Admitting: Internal Medicine

## 2013-06-29 ENCOUNTER — Ambulatory Visit: Payer: BC Managed Care – PPO

## 2013-06-29 VITALS — BP 110/60 | HR 73 | Temp 98.3°F | Resp 16 | Ht 68.0 in | Wt 159.0 lb

## 2013-06-29 DIAGNOSIS — J45909 Unspecified asthma, uncomplicated: Secondary | ICD-10-CM

## 2013-06-29 MED ORDER — PREDNISONE 20 MG PO TABS: ORAL_TABLET | ORAL | Status: DC

## 2013-06-29 MED ORDER — AZITHROMYCIN 250 MG PO TABS: ORAL_TABLET | ORAL | Status: DC

## 2013-06-29 NOTE — Progress Notes (Addendum)
Subjective:    Patient ID: Adrienne Reese, female    DOB: [birth year], 43 y.o.   MRN: [id number]  HPI This chart was scribed for Tami Lin, MD by Thea Alken, ED Scribe. This patient was seen in room 8 and the patient's care was started at 6:26 PM.  HPI Comments: Adrienne Reese is a 43 y.o. female who presents to the [facility] complaining of chest congestion onset 5 days. She reports she had a cold about 7 days ago which started with head congestion and her illness shortly moved to her chest 1-2 day later. She is now complaining of SOB and windedness especially when talking . She no longer has a sore throat but did have one about a week ago. She has a mild nonproductive cough. She states that she took severe cold and sinus as well as nyquil last night. She denies fever, chills, nausea, sinus pressure, emesis and diarrhea. She reports that her 41 y.o was sick last weekend and that her older son was sick about 2 weeks ago. She reports that she has h/o of bronchitis. She has no h/o asthma.    Past Medical History  Diagnosis Date  . Anxiety   . Cancer 2002    melanoma/basal cell  . H/O bladder infections   . Back pain   . Hemorrhoids   . Arthritis   . History of depression   . History of kidney stones    Allergies  Allergen Reactions  . Clarithromycin Nausea And Vomiting  . Ranitidine Hcl Hives and Swelling  . Sulfa Antibiotics Hives   Prior to Admission medications   Medication Sig Start Date End Date Taking? Authorizing Provider  diazepam (VALIUM) 5 MG tablet Take 6 mg by mouth as needed for anxiety.    Historical Provider, MD  mupirocin ointment (BACTROBAN) 2 % Apply topically 2 (two) times daily. Apply to affected area 03/14/13   Abner Greenspan, MD      Review of Systems  Constitutional: Negative for fever and chills.  HENT: Negative for ear pain, rhinorrhea, sinus pressure and sore throat.   Respiratory: Positive for cough ( mild  nonproductive), chest tightness and shortness of breath.   Gastrointestinal: Negative for nausea, vomiting and diarrhea.       Objective:   Physical Exam  Constitutional: She is oriented to person, place, and time. She appears well-developed and well-nourished. No distress.  HENT:  Right Ear: External ear normal.  Left Ear: External ear normal.  Nose: Nose normal.  Mouth/Throat: Oropharynx is clear and moist.  Eyes: Conjunctivae are normal. Pupils are equal, round, and reactive to light.  Neck: No thyromegaly present.  Cardiovascular: Normal rate, regular rhythm, normal heart sounds and intact distal pulses.   No murmur heard. Pulmonary/Chest: Effort normal. She has wheezes.  Rhonchi at the left base Bilateral wheezing with forced expiration mild  Musculoskeletal: She exhibits no edema.  Lymphadenopathy:    She has no cervical adenopathy.  Neurological: She is alert and oriented to person, place, and time.  Psychiatric: She has a normal mood and affect.   Filed Vitals:   06/29/13 1747  BP: 110/60  Pulse: 73  Temp: 98.3 F (36.8 C)  Resp: 16   [facility] reading (PRIMARY) by  Dr. Laney Pastor? Lingular infiltrate      Assessment & Plan:   I have completed the patient encounter in its entirety as documented by the scribe, with editing by me where necessary. Nakeem Murnane P. Laney Pastor, M.D. RAD (  reactive airway disease) - Plan: DG Chest 2 View  Lower Respiratory Infection-?mycoplasma  Meds ordered this encounter  Medications  . azithromycin (ZITHROMAX) 250 MG tablet    Sig: As packaged    Dispense:  6 tablet    Refill:  0  . predniSONE (DELTASONE) 20 MG tablet    Sig: 3/3/2/2/1/1 single daily dose for 6 days    Dispense:  12 tablet    Refill:  0

## 2013-06-30 ENCOUNTER — Telehealth: Payer: Self-pay

## 2013-06-30 ENCOUNTER — Other Ambulatory Visit: Payer: Self-pay | Admitting: Internal Medicine

## 2013-06-30 ENCOUNTER — Ambulatory Visit (INDEPENDENT_AMBULATORY_CARE_PROVIDER_SITE_OTHER): Payer: BC Managed Care – PPO | Admitting: Internal Medicine

## 2013-06-30 VITALS — BP 122/64 | HR 77 | Temp 97.7°F | Resp 18 | Ht 68.0 in | Wt 158.0 lb

## 2013-06-30 DIAGNOSIS — R5381 Other malaise: Secondary | ICD-10-CM

## 2013-06-30 DIAGNOSIS — R5383 Other fatigue: Secondary | ICD-10-CM

## 2013-06-30 DIAGNOSIS — R0602 Shortness of breath: Secondary | ICD-10-CM

## 2013-06-30 LAB — POCT CBC
Granulocyte percent: 74.8 %G (ref 37–80)
HEMATOCRIT: 42.8 % (ref 37.7–47.9)
Hemoglobin: 13.9 g/dL (ref 12.2–16.2)
LYMPH, POC: 1.5 (ref 0.6–3.4)
MCH, POC: 30.3 pg (ref 27–31.2)
MCHC: 32.5 g/dL (ref 31.8–35.4)
MCV: 93.2 fL (ref 80–97)
MID (cbc): 0.2 (ref 0–0.9)
MPV: 9 fL (ref 0–99.8)
POC Granulocyte: 5.1 (ref 2–6.9)
POC LYMPH PERCENT: 22.5 %L (ref 10–50)
POC MID %: 2.7 %M (ref 0–12)
Platelet Count, POC: 279 10*3/uL (ref 142–424)
RBC: 4.59 M/uL (ref 4.04–5.48)
RDW, POC: 12.7 %
WBC: 6.8 10*3/uL (ref 4.6–10.2)

## 2013-06-30 LAB — POCT SEDIMENTATION RATE: POCT SED RATE: 37 mm/hr — AB (ref 0–22)

## 2013-06-30 NOTE — Telephone Encounter (Signed)
Patient states that she was seen yesterday and diagnosed with pneumonia. Patient states that today she is having chest pains and a difficult time breathing and talking. Advised patient to return to clinic.  [phone]

## 2013-06-30 NOTE — Telephone Encounter (Signed)
Pt is rtc. She is very winded talking to me on the phone. She reports last night she was feeling better but this afternoon she is worse than she was yesterday.  She will be in this evening to see Dr Laney Pastor.

## 2013-06-30 NOTE — Progress Notes (Addendum)
   Subjective:    Patient ID: Adrienne Reese, female    DOB: 1971/02/12, 43 y.o.   MRN: [id number] This chart was scribed for Tami Lin, MD by Vernell Barrier, Medical Scribe. This patient's care was started at 5:55 PM.  HPI HPI Comments: Adrienne Reese is a 43 y.o. female who presents to the [facility] complaining of fatigue, SOB, and hoarseness. States she has felt slightly cold today but denies any fever. Denies any change in coughing as reported yesterday. Pt reports some chest discomfort which she did not have before. She describe the pain as achy. States she ate breakfast this morning but has dropped two pounds since getting sick 6 days ago. Pt was seen yesterday by Dr. Laney Pastor for chest congestion, cough and SOB. She was treated with Zithromax and Prednisone. She states she began to feel better yesterday, until a few hours ago, symptoms began to return. States she has felt relatively healthy for the last 3 months.   Review of Systems   Objective:   Physical Exam  Vitals reviewed. Constitutional: She is oriented to person, place, and time. She appears well-developed and well-nourished. No distress.  HENT:  Head: Normocephalic and atraumatic.  Eyes: EOM are normal.  Neck: Normal range of motion. Neck supple.  Cardiovascular: Normal rate, regular rhythm and normal heart sounds.  Exam reveals no gallop and no friction rub.   No murmur heard. Pulmonary/Chest: Effort normal. No respiratory distress. She has no wheezes. She has no rales.  Musculoskeletal: Normal range of motion.  Neurological: She is alert and oriented to person, place, and time.  Skin: Skin is warm and dry.  Psychiatric: She has a normal mood and affect. Her behavior is normal.   Results for orders placed in visit on 06/30/13  POCT CBC      Result Value Range   WBC 6.8  4.6 - 10.2 K/uL   Lymph, poc 1.5  0.6 - 3.4   POC LYMPH PERCENT 22.5  10 - 50 %L   MID (cbc) 0.2  0 - 0.9   POC MID % 2.7  0 - 12 %M   POC Granulocyte 5.1  2 - 6.9   Granulocyte percent 74.8  37 - 80 %G   RBC 4.59  4.04 - 5.48 M/uL   Hemoglobin 13.9  12.2 - 16.2 g/dL   HCT, POC 42.8  37.7 - 47.9 %   MCV 93.2  80 - 97 fL   MCH, POC 30.3  27 - 31.2 pg   MCHC 32.5  31.8 - 35.4 g/dL   RDW, POC 12.7     Platelet Count, POC 279  142 - 424 K/uL   MPV 9.0  0 - 99.8 fL    Assessment & Plan:  I have completed the patient encounter in its entirety as documented by the scribe, with editing by me where necessary. Tiyana Galla P. Laney Pastor, M.D.  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

## 2013-07-01 LAB — COMPREHENSIVE METABOLIC PANEL
ALT: 24 U/L (ref 0–35)
AST: 22 U/L (ref 0–37)
Albumin: 4.7 g/dL (ref 3.5–5.2)
Alkaline Phosphatase: 64 U/L (ref 39–117)
BUN: 11 mg/dL (ref 6–23)
CALCIUM: 9.9 mg/dL (ref 8.4–10.5)
CHLORIDE: 103 meq/L (ref 96–112)
CO2: 25 meq/L (ref 19–32)
Creat: 0.73 mg/dL (ref 0.50–1.10)
Glucose, Bld: 131 mg/dL — ABNORMAL HIGH (ref 70–99)
POTASSIUM: 4.2 meq/L (ref 3.5–5.3)
Sodium: 137 mEq/L (ref 135–145)
Total Bilirubin: 0.4 mg/dL (ref 0.3–1.2)
Total Protein: 7.9 g/dL (ref 6.0–8.3)

## 2013-07-01 LAB — T4, FREE: Free T4: 1.13 ng/dL (ref 0.80–1.80)

## 2013-07-01 LAB — TSH: TSH: 0.282 u[IU]/mL — AB (ref 0.350–4.500)

## 2013-07-17 IMAGING — CR DG WRIST COMPLETE 3+V*R*
1 series · 1 of 1 positions shown · non-contrast
Comparison: None

CLINICAL DATA: Wrist pain

RIGHT WRIST - COMPLETE 3+ VIEW

[PA]
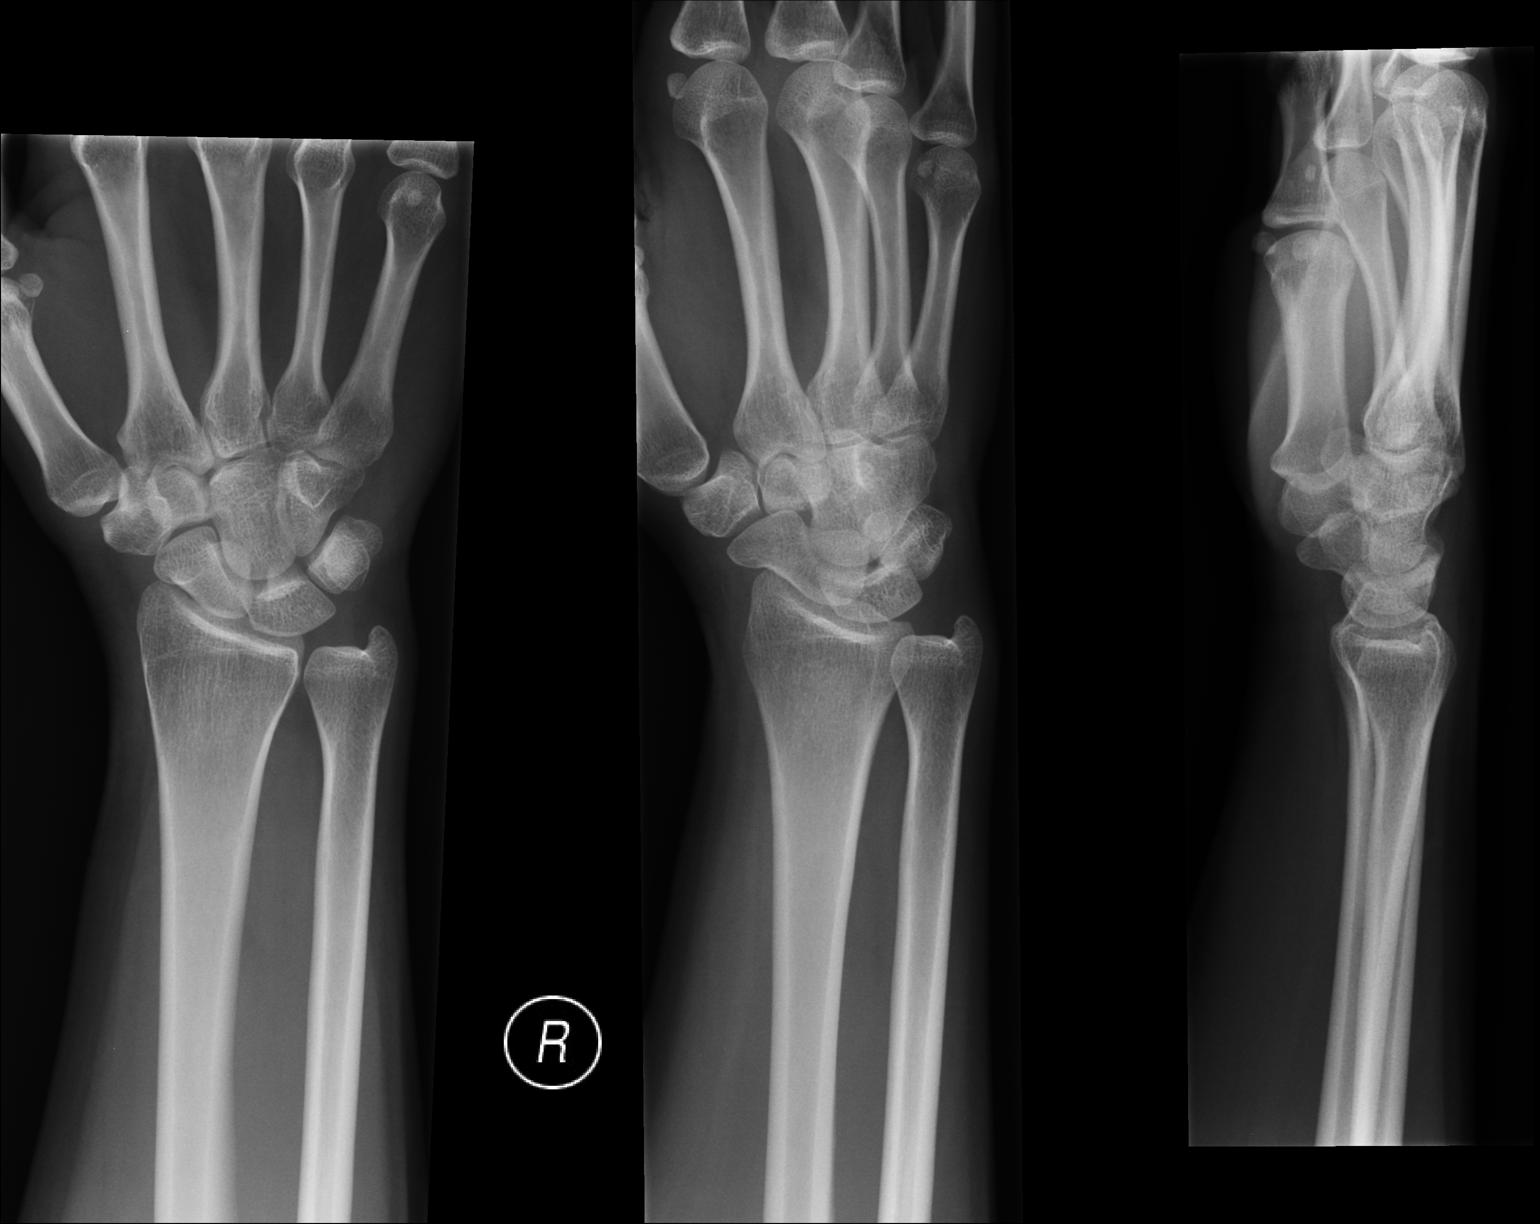

[1 of 1 positions shown; findings below may reference images not displayed]

FINDINGS: Bone mineralization normal.
Joint spaces preserved.
No fracture, dislocation, or bone destruction.
IMPRESSION: Normal exam.

Clinically significant discrepancy from primary report, if
provided: None

## 2013-07-21 ENCOUNTER — Encounter: Payer: Self-pay | Admitting: Family Medicine

## 2013-07-21 ENCOUNTER — Ambulatory Visit (INDEPENDENT_AMBULATORY_CARE_PROVIDER_SITE_OTHER): Payer: BC Managed Care – PPO | Admitting: Family Medicine

## 2013-07-21 VITALS — HR 78 | Temp 98.6°F | Ht 68.0 in | Wt 161.2 lb

## 2013-07-21 DIAGNOSIS — L659 Nonscarring hair loss, unspecified: Secondary | ICD-10-CM

## 2013-07-21 DIAGNOSIS — R42 Dizziness and giddiness: Secondary | ICD-10-CM | POA: Insufficient documentation

## 2013-07-21 DIAGNOSIS — L219 Seborrheic dermatitis, unspecified: Secondary | ICD-10-CM

## 2013-07-21 NOTE — Progress Notes (Signed)
Pre-visit discussion using our clinic review tool. No additional management support is needed unless otherwise documented below in the visit note.  

## 2013-07-21 NOTE — Assessment & Plan Note (Signed)
Suspect this is left over from recent uri Fleeting - so meclizine will not help  Disc moving head slowly Update if not starting to improve in a week or if worsening  - or if headache or new symptoms

## 2013-07-21 NOTE — Assessment & Plan Note (Signed)
Enc pt to use otc selsun blue shampoo twice monthly-update if not imp

## 2013-07-21 NOTE — Assessment & Plan Note (Signed)
This is diffuse-no focal allopecia  Rev thyroid labs 1/15 -with slt low tsh and nl free T4-will watch Some stressors and recent illness No fam hx of female hair loss  Will watch/ tx seb derm- ref to derm if no imp

## 2013-07-21 NOTE — Patient Instructions (Signed)
Start using selsun blue shampoo to calm your scalp at least twice per week  If hair loss worsens- call for a referral to dermatology  Take care of yourself  If dizziness persists -let me know -- this may be an inner ear issue left over from your recent upper respiratory infection

## 2013-07-21 NOTE — Progress Notes (Signed)
Subjective:    Patient ID: Adrienne Reese, female    DOB: April 20, 1971, 43 y.o.   MRN: [id number]  HPI Here for hair loss/ itchy and tender scalp  Hair is coming out diffusely all over -sees it in her brush  Her hair used to be thicker and curlier   Some stress- recently moved here in July   Itchy scalp but no skin peeling    Lab Results  Component Value Date   TSH 0.282* 06/30/2013    No hx of thyroid issues   Some inner ear issues - a little dizzy now and then - with change in head position  Felt weird in a book store when she looked up to grab a book for a split second   No fever  Had pneumonia last mo- and took zpac and prednisone- getting better (had GI symptoms from the abx)  Patient Active Problem List   Diagnosis Date Noted  . Seborrheic dermatitis 07/21/2013  . Hair loss 07/21/2013  . Dizziness 07/21/2013  . History of neck surgery 03/16/2013  . Papule 03/14/2013  . Unspecified vitamin D deficiency 02/17/2013  . Spinal stenosis in cervical region 02/17/2013  . Dysuria 02/17/2013  . DDD (degenerative disc disease), cervical 07/26/2012  . Family history of breast cancer in mother 07/24/2012   Past Medical History  Diagnosis Date  . Anxiety   . Cancer 2002    melanoma/basal cell  . H/O bladder infections   . Back pain   . Hemorrhoids   . Arthritis   . History of depression   . History of kidney stones    Past Surgical History  Procedure Laterality Date  . Cesarean section  2005  . Foot surgery  2001   History  Substance Use Topics  . Smoking status: Never Smoker   . Smokeless tobacco: Not on file  . Alcohol Use: Yes     Comment: drink 1-2 glasses of wine twice a year   Family History  Problem Relation Age of Onset  . Positive PPD/TB Exposure Neg Hx   . Cancer Mother     breast - left and melanoma  . Hypertension Brother   . Hyperlipidemia Brother   . Cancer Maternal Grandmother     colon and melanoma  . Alcohol abuse Maternal Aunt   .  Alcohol abuse Maternal Grandfather   . Arthritis Maternal Grandmother   . Breast cancer Mother   . Hyperlipidemia Mother   . Hyperlipidemia Maternal Grandmother   . Hypertension Maternal Grandmother   . Heart disease Maternal Grandmother   . Depression Mother   . Depression Father    Allergies  Allergen Reactions  . Clarithromycin Nausea And Vomiting  . Ranitidine Hcl Hives and Swelling  . Sulfa Antibiotics Hives  . Zithromax [Azithromycin]     GI   No current outpatient prescriptions on file prior to visit.   No current facility-administered medications on file prior to visit.     Review of Systems Review of Systems  Constitutional: Negative for fever, appetite change, fatigue and unexpected weight change.  Eyes: Negative for pain and visual disturbance.  Respiratory: Negative for cough and shortness of breath.   Cardiovascular: Negative for cp or palpitations    Gastrointestinal: Negative for nausea, diarrhea and constipation.  Genitourinary: Negative for urgency and frequency.  Skin: Negative for pallor or rash  pos for itchy scalp Neurological: Negative for weakness, light-, numbness and headaches.  Hematological: Negative for adenopathy. Does not bruise/bleed easily.  Psychiatric/Behavioral: Negative for dysphoric mood. The patient is not nervous/anxious.         Objective:   Physical Exam  Constitutional: She appears well-developed and well-nourished. No distress.  HENT:  Head: Normocephalic and atraumatic.  Right Ear: External ear normal.  Left Ear: External ear normal.  Mouth/Throat: Oropharynx is clear and moist. No oropharyngeal exudate.  Nares are boggy Tms clear No sinus tenderness  Eyes: Conjunctivae and EOM are normal. Pupils are equal, round, and reactive to light. Right eye exhibits no discharge. Left eye exhibits no discharge. No scleral icterus.  0-1 beat of horizontal nystagmus bilat   Neck: Normal range of motion. Neck supple.  Cardiovascular:  Normal rate and regular rhythm.   Pulmonary/Chest: Effort normal and breath sounds normal. No respiratory distress. She has no wheezes. She has no rales.  Musculoskeletal: She exhibits no edema.  Lymphadenopathy:    She has no cervical adenopathy.  Neurological: She is alert. She has normal reflexes. No cranial nerve deficit. She exhibits normal muscle tone. Coordination normal.  No tremor   Skin: Skin is warm and dry. No rash noted. No erythema. No pallor.  Some mild flakiness in scalp No focal allopecia noted     Psychiatric: She has a normal mood and affect.          Assessment & Plan:

## 2013-09-02 IMAGING — CR DG CERVICAL SPINE 2 OR 3 VIEWS
3 series · 3 of 3 positions shown · non-contrast
Comparison: None

CLINICAL DATA: Neck pain.

CERVICAL SPINE - 2-3 VIEW

[lateral]
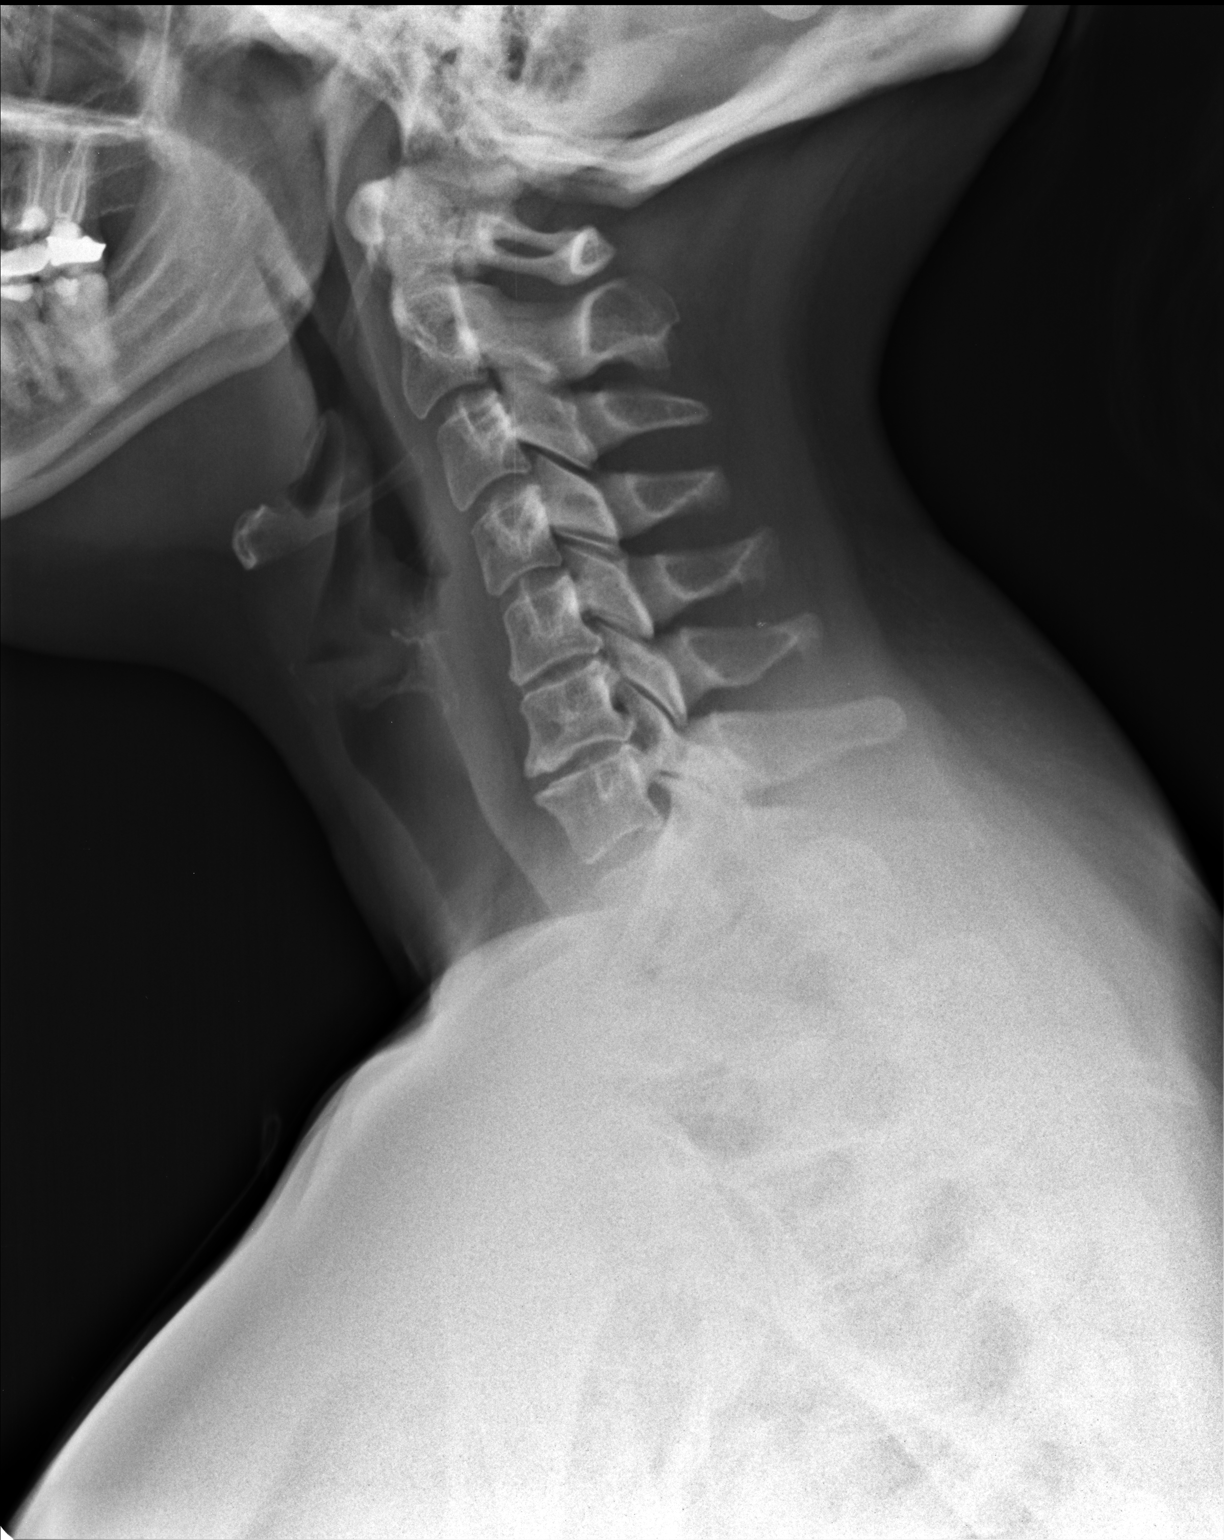

[AP (1 of 2)]
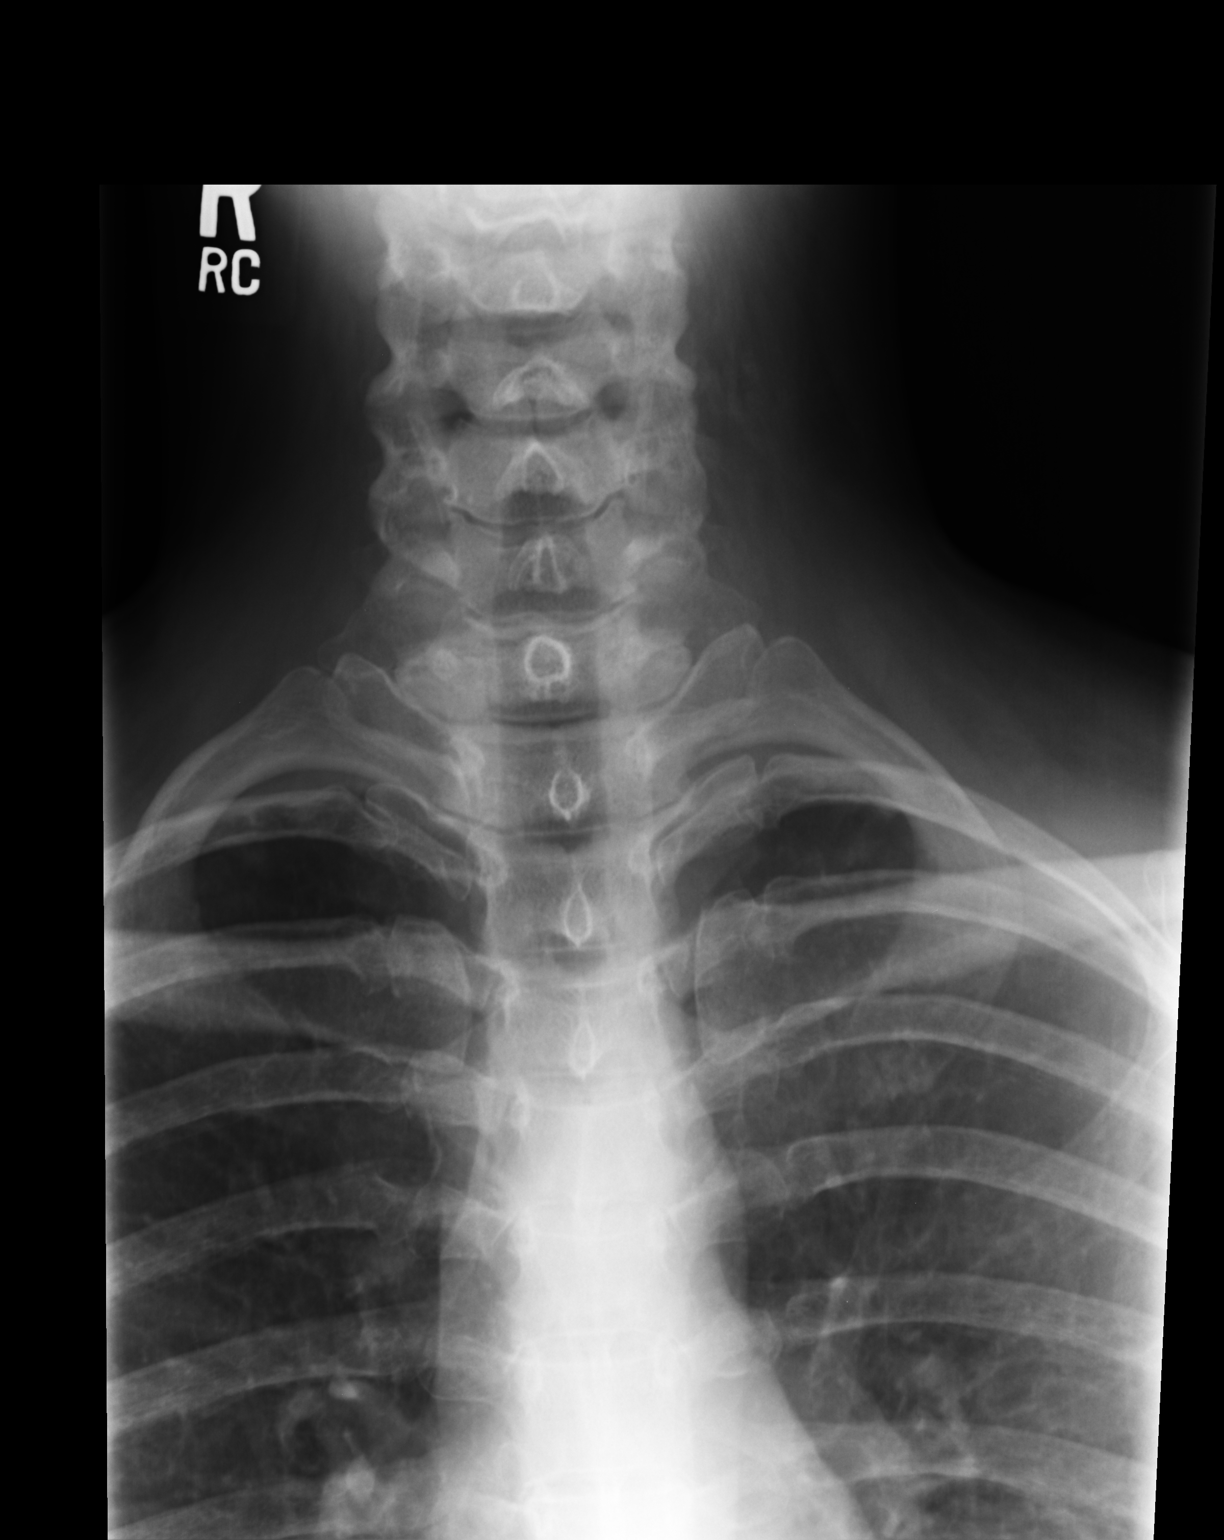

[AP (2 of 2)]
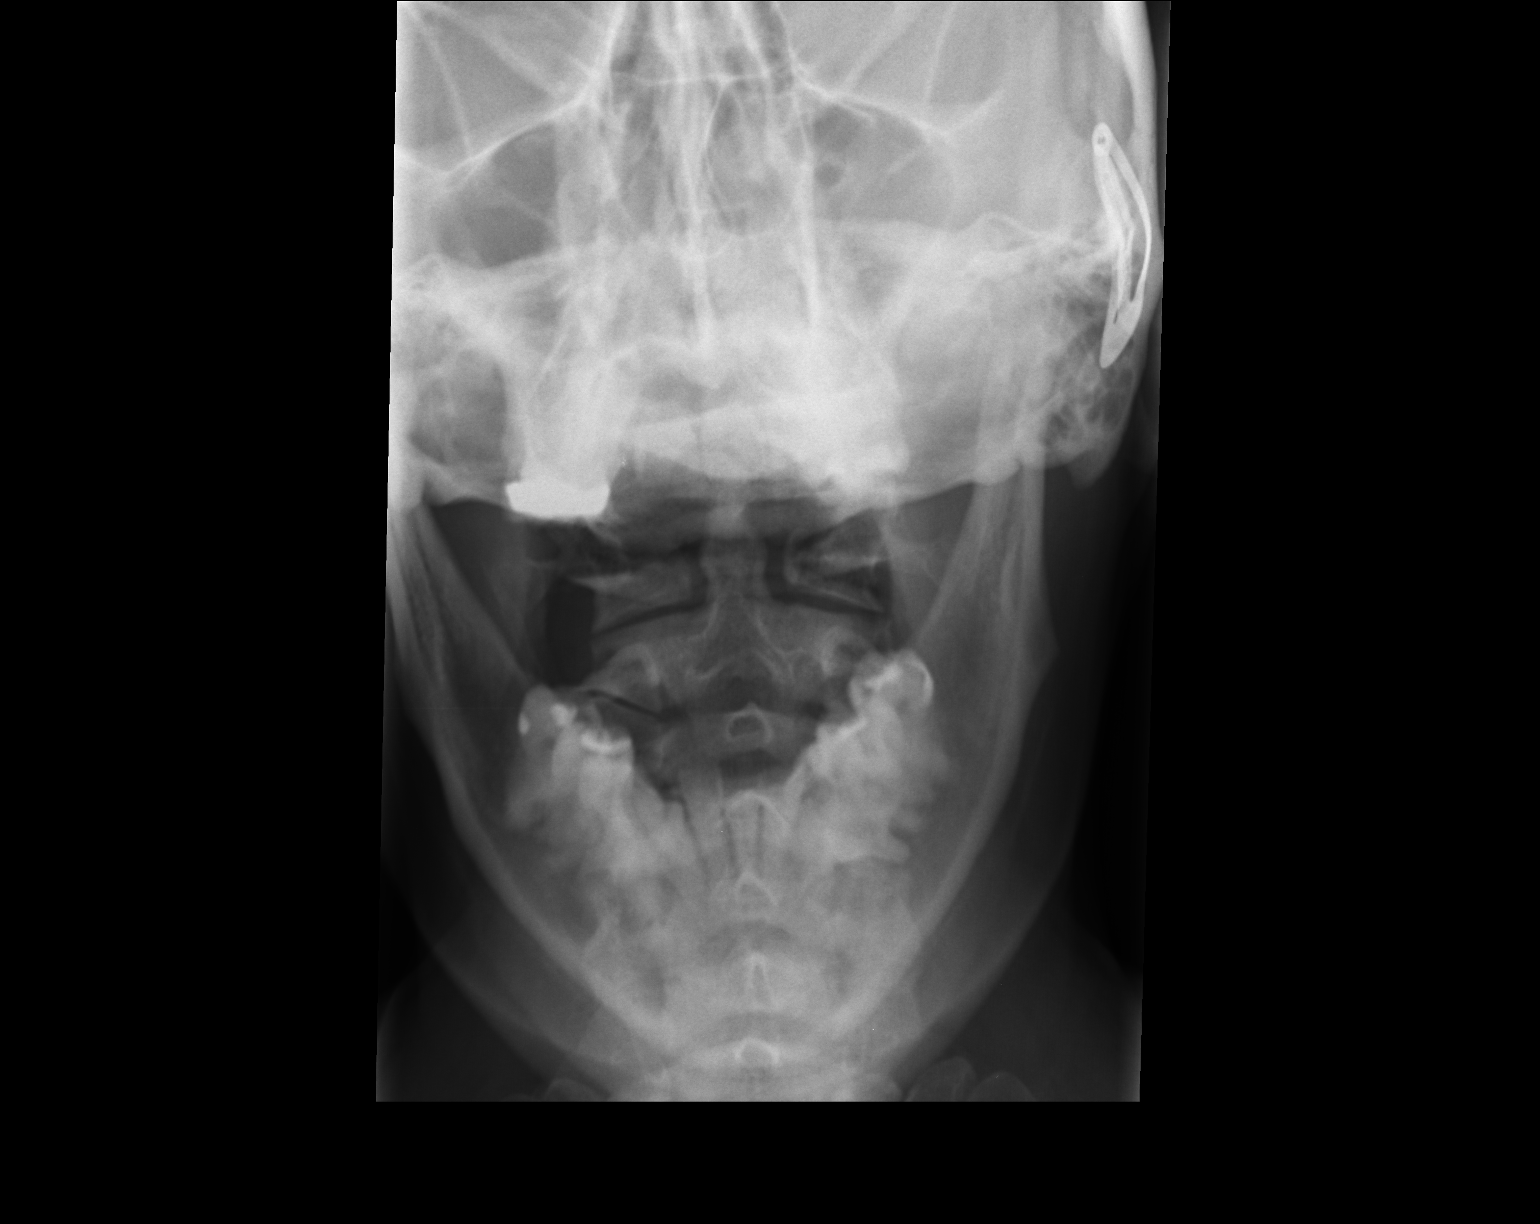

[3 of 3 positions shown; findings below may reference images not displayed]

FINDINGS: Normal alignment is noted.
Mild straightening of the normal cervical lordosis noted.
There is no evidence of fracture, subluxation or prevertebral soft
tissue swelling.
Mild to moderate degenerative disc disease and spondylosis at C5-C6
and C6-C7 identified.
No focal bony lesions are present.
IMPRESSION: No static evidence of acute injury to the cervical spine.

Mild to moderate degenerative disc disease and spondylosis at C5-C6
and C6-C7.

Clinically significant discrepancy from primary report, if
provided: None

## 2013-10-03 ENCOUNTER — Encounter: Payer: Self-pay | Admitting: Family Medicine

## 2013-10-03 ENCOUNTER — Ambulatory Visit (INDEPENDENT_AMBULATORY_CARE_PROVIDER_SITE_OTHER): Payer: BC Managed Care – PPO | Admitting: Family Medicine

## 2013-10-03 VITALS — BP 104/68 | HR 83 | Temp 98.2°F | Ht 68.0 in | Wt 162.8 lb

## 2013-10-03 DIAGNOSIS — R6889 Other general symptoms and signs: Secondary | ICD-10-CM | POA: Insufficient documentation

## 2013-10-03 DIAGNOSIS — F411 Generalized anxiety disorder: Secondary | ICD-10-CM

## 2013-10-03 DIAGNOSIS — E559 Vitamin D deficiency, unspecified: Secondary | ICD-10-CM

## 2013-10-03 DIAGNOSIS — F4323 Adjustment disorder with mixed anxiety and depressed mood: Secondary | ICD-10-CM | POA: Insufficient documentation

## 2013-10-03 DIAGNOSIS — R5383 Other fatigue: Secondary | ICD-10-CM | POA: Insufficient documentation

## 2013-10-03 DIAGNOSIS — N920 Excessive and frequent menstruation with regular cycle: Secondary | ICD-10-CM

## 2013-10-03 DIAGNOSIS — F419 Anxiety disorder, unspecified: Secondary | ICD-10-CM

## 2013-10-03 DIAGNOSIS — R5381 Other malaise: Secondary | ICD-10-CM

## 2013-10-03 LAB — CBC WITH DIFFERENTIAL/PLATELET
BASOS ABS: 0 10*3/uL (ref 0.0–0.1)
Basophils Relative: 0.5 % (ref 0.0–3.0)
EOS ABS: 0.1 10*3/uL (ref 0.0–0.7)
Eosinophils Relative: 1.2 % (ref 0.0–5.0)
HEMATOCRIT: 41.3 % (ref 36.0–46.0)
Hemoglobin: 14.3 g/dL (ref 12.0–15.0)
LYMPHS ABS: 2.3 10*3/uL (ref 0.7–4.0)
LYMPHS PCT: 35.8 % (ref 12.0–46.0)
MCHC: 34.5 g/dL (ref 30.0–36.0)
MCV: 89.8 fl (ref 78.0–100.0)
MONO ABS: 0.5 10*3/uL (ref 0.1–1.0)
Monocytes Relative: 7.6 % (ref 3.0–12.0)
NEUTROS ABS: 3.5 10*3/uL (ref 1.4–7.7)
Neutrophils Relative %: 54.9 % (ref 43.0–77.0)
Platelets: 310 10*3/uL (ref 150.0–400.0)
RBC: 4.6 Mil/uL (ref 3.87–5.11)
RDW: 12.6 % (ref 11.5–14.6)
WBC: 6.3 10*3/uL (ref 4.5–10.5)

## 2013-10-03 LAB — COMPREHENSIVE METABOLIC PANEL
ALK PHOS: 48 U/L (ref 39–117)
ALT: 15 U/L (ref 0–35)
AST: 15 U/L (ref 0–37)
Albumin: 4.5 g/dL (ref 3.5–5.2)
BILIRUBIN TOTAL: 0.5 mg/dL (ref 0.3–1.2)
BUN: 13 mg/dL (ref 6–23)
CO2: 27 mEq/L (ref 19–32)
CREATININE: 0.8 mg/dL (ref 0.4–1.2)
Calcium: 9.7 mg/dL (ref 8.4–10.5)
Chloride: 102 mEq/L (ref 96–112)
GFR: 86.77 mL/min (ref >60.00)
Glucose, Bld: 74 mg/dL (ref 70–99)
Potassium: 4.2 mEq/L (ref 3.5–5.1)
Sodium: 138 mEq/L (ref 135–145)
Total Protein: 7.8 g/dL (ref 6.0–8.3)

## 2013-10-03 LAB — T4, FREE: FREE T4: 0.9 ng/dL (ref 0.60–1.60)

## 2013-10-03 LAB — TSH: TSH: 0.26 u[IU]/mL — ABNORMAL LOW (ref 0.35–5.50)

## 2013-10-03 NOTE — Progress Notes (Signed)
Subjective:    Patient ID: Adrienne Reese, female    DOB: [birth year], 43 y.o.   MRN: [id number]  HPI Here with several issues   Extremely tired lately  Works 4 hours per day -not much and not stressful- exhausted when she gets home   She takes 2000 iu daily vit D  Some stress - family / husb/ ex/ kids   Anxiety is worse  No full blown panic attacks but she gets rapid heartbeat randomly and feels panicked/anxious  These episodes are more frequent the last month- happens constantly throughout the day  Is overwhelmed at times Not depressed   PMS is worse also with age      Chemistry      Component Value Date/Time   NA 137 06/30/2013 1804   K 4.2 06/30/2013 1804   CL 103 06/30/2013 1804   CO2 25 06/30/2013 1804   BUN 11 06/30/2013 1804   CREATININE 0.73 06/30/2013 1804      Component Value Date/Time   CALCIUM 9.9 06/30/2013 1804   ALKPHOS 64 06/30/2013 1804   AST 22 06/30/2013 1804   ALT 24 06/30/2013 1804   BILITOT 0.4 06/30/2013 1804      Lab Results  Component Value Date   WBC 6.8 06/30/2013   HGB 13.9 06/30/2013   HCT 42.8 06/30/2013   MCV 93.2 06/30/2013   PLT 297 07/24/2012    Lab Results  Component Value Date   TSH 0.282* 06/30/2013    Free T4 was ok   Has very heavy periods also -worse as she gets older  Dr Edwena Blow in gyn  Has not gone for a while    Patient Active Problem List   Diagnosis Date Noted  . Seborrheic dermatitis 07/21/2013  . Hair loss 07/21/2013  . Dizziness 07/21/2013  . History of neck surgery 03/16/2013  . Papule 03/14/2013  . Unspecified vitamin D deficiency 02/17/2013  . Spinal stenosis in cervical region 02/17/2013  . Dysuria 02/17/2013  . DDD (degenerative disc disease), cervical 07/26/2012  . Family history of breast cancer in mother 07/24/2012   Past Medical History  Diagnosis Date  . Anxiety   . Cancer 2002    melanoma/basal cell  . H/O bladder infections   . Back pain   . Hemorrhoids   . Arthritis   . History of  depression   . History of kidney stones    Past Surgical History  Procedure Laterality Date  . Cesarean section  2005  . Foot surgery  2001   History  Substance Use Topics  . Smoking status: Never Smoker   . Smokeless tobacco: Not on file  . Alcohol Use: Yes     Comment: drink 1-2 glasses of wine twice a year   Family History  Problem Relation Age of Onset  . Positive PPD/TB Exposure Neg Hx   . Cancer Mother     breast - left and melanoma  . Hypertension Brother   . Hyperlipidemia Brother   . Cancer Maternal Grandmother     colon and melanoma  . Alcohol abuse Maternal Aunt   . Alcohol abuse Maternal Grandfather   . Arthritis Maternal Grandmother   . Breast cancer Mother   . Hyperlipidemia Mother   . Hyperlipidemia Maternal Grandmother   . Hypertension Maternal Grandmother   . Heart disease Maternal Grandmother   . Depression Mother   . Depression Father    Allergies  Allergen Reactions  . Clarithromycin Nausea And Vomiting  .  Ranitidine Hcl Hives and Swelling  . Sulfa Antibiotics Hives  . Zithromax [Azithromycin]     GI   Current Outpatient Prescriptions on File Prior to Visit  Medication Sig Dispense Refill  . Cholecalciferol (VITAMIN D3) 2000 UNITS CHEW Chew 1 tablet by mouth daily.       No current facility-administered medications on file prior to visit.    Review of Systems Review of Systems  Constitutional: Negative for fever, appetite change,  and unexpected weight change.  Eyes: Negative for pain and visual disturbance.  Respiratory: Negative for cough and shortness of breath.   Cardiovascular: Negative for cp or palpitations    Gastrointestinal: Negative for nausea, diarrhea and constipation.  Genitourinary: Negative for urgency and frequency. pos for heavy menses  Skin: Negative for pallor or rash   Neurological: Negative for weakness, light-headedness, numbness and headaches.  Hematological: Negative for adenopathy. Does not bruise/bleed easily.    Psychiatric/Behavioral: Negative for dysphoric mood. The patient is nervous/anxious.  pos for numerous stressors, also pos for PMS symptoms        Objective:   Physical Exam  Constitutional: She appears well-developed and well-nourished. No distress.  HENT:  Head: Normocephalic and atraumatic.  Right Ear: External ear normal.  Left Ear: External ear normal.  Nose: Nose normal.  Mouth/Throat: Oropharynx is clear and moist.  Eyes: Conjunctivae and EOM are normal. Pupils are equal, round, and reactive to light. Right eye exhibits no discharge. Left eye exhibits no discharge. No scleral icterus.  Neck: Normal range of motion. Neck supple. No JVD present. No thyromegaly present.  Cardiovascular: Normal rate, regular rhythm, normal heart sounds and intact distal pulses.  Exam reveals no gallop.   Pulmonary/Chest: Effort normal and breath sounds normal. No respiratory distress. She has no wheezes. She has no rales.  Abdominal: Soft. Bowel sounds are normal. She exhibits no distension and no mass. There is no tenderness.  Musculoskeletal: She exhibits no edema and no tenderness.  Lymphadenopathy:    She has no cervical adenopathy.  Neurological: She is alert. She has normal reflexes. No cranial nerve deficit. She exhibits normal muscle tone. Coordination normal.  Skin: Skin is warm and dry. No rash noted. No erythema. No pallor.  Psychiatric: Her speech is normal and behavior is normal. Thought content normal. Her mood appears anxious. Her affect is not blunt and not labile. She does not exhibit a depressed mood.  Not tearful  Talks candidly about stressors          Assessment & Plan:

## 2013-10-03 NOTE — Progress Notes (Signed)
Pre visit review using our clinic review tool, if applicable. No additional management support is needed unless otherwise documented below in the visit note. 

## 2013-10-03 NOTE — Patient Instructions (Signed)
Labs today for fatigue and thyroid and vit D levels  Let's see how these look before making a plan  We will refer you to counseling- stop at check out for that

## 2013-10-04 LAB — VITAMIN D 25 HYDROXY (VIT D DEFICIENCY, FRACTURES): VIT D 25 HYDROXY: 35 ng/mL (ref 30–89)

## 2013-10-05 NOTE — Assessment & Plan Note (Signed)
Cbc today  Enc to f/u with her gyn

## 2013-10-05 NOTE — Assessment & Plan Note (Signed)
With fatigue-no sob or cp  Lab today incl cbc

## 2013-10-05 NOTE — Assessment & Plan Note (Signed)
Labs today  Disc poss of stress/anx rel fatigue/ thyroid disorder/ anemia from heavy menses/ post infx syndrome or a mix of these

## 2013-10-05 NOTE — Assessment & Plan Note (Signed)
Disc stressors Reviewed stressors/ coping techniques/symptoms/ support sources/ tx options and side effects in detail today Ref to psychology for counseling

## 2013-10-05 NOTE — Assessment & Plan Note (Signed)
D level today Pt is having fatigue- ? If related Disc imp to her bone health

## 2013-10-09 ENCOUNTER — Telehealth: Payer: Self-pay | Admitting: Family Medicine

## 2013-10-09 DIAGNOSIS — R5381 Other malaise: Secondary | ICD-10-CM

## 2013-10-09 DIAGNOSIS — R6889 Other general symptoms and signs: Secondary | ICD-10-CM

## 2013-10-09 DIAGNOSIS — R5383 Other fatigue: Secondary | ICD-10-CM

## 2013-10-09 DIAGNOSIS — R7989 Other specified abnormal findings of blood chemistry: Secondary | ICD-10-CM

## 2013-10-09 NOTE — Telephone Encounter (Signed)
Message copied by Abner Greenspan on Thu Oct 09, 2013  4:45 PM ------      Message from: Tammi Sou      Created: Thu Oct 09, 2013  3:11 PM       Pt advise of labs and Dr. Marliss Coots comments, pt does agree with referral to endo, I advise pt that Marion/Linda will call to get an appt scheduled, please put referral in ------

## 2013-10-09 NOTE — Telephone Encounter (Signed)
Will refer to endocrine for low tsh

## 2013-10-14 ENCOUNTER — Encounter: Payer: Self-pay | Admitting: *Deleted

## 2013-10-30 ENCOUNTER — Ambulatory Visit (INDEPENDENT_AMBULATORY_CARE_PROVIDER_SITE_OTHER): Payer: BC Managed Care – PPO | Admitting: Internal Medicine

## 2013-10-30 ENCOUNTER — Encounter: Payer: Self-pay | Admitting: Internal Medicine

## 2013-10-30 VITALS — BP 114/66 | HR 105 | Temp 98.3°F | Resp 12 | Ht 68.0 in | Wt 164.6 lb

## 2013-10-30 DIAGNOSIS — R946 Abnormal results of thyroid function studies: Secondary | ICD-10-CM

## 2013-10-30 DIAGNOSIS — R7989 Other specified abnormal findings of blood chemistry: Secondary | ICD-10-CM

## 2013-10-30 LAB — T4, FREE: Free T4: 0.78 ng/dL (ref 0.60–1.60)

## 2013-10-30 LAB — T3, FREE: T3, Free: 3 pg/mL (ref 2.3–4.2)

## 2013-10-30 LAB — TSH: TSH: 0.36 u[IU]/mL (ref 0.35–4.50)

## 2013-10-30 NOTE — Progress Notes (Signed)
Patient ID: Adrienne Reese, female   DOB: 10-Jul-1970, 43 y.o.   MRN: [id number]   HPI  Adrienne Reese is a 43 y.o.-year-old female, referred by her PCP, Dr. Glori Bickers, in consultation for low TSH.  She presented to her PCP with several sxs in 06/2013 (see below) and a TSH was found to be low. This was confirmed 1 mo ago.  I reviewed pt's thyroid tests: Lab Results  Component Value Date   TSH 0.26* 10/03/2013   TSH 0.282* 06/30/2013   TSH 0.473 07/24/2012   FREET4 0.90 10/03/2013   FREET4 1.13 06/30/2013    Pt denies feeling nodules in neck, hoarseness, dysphagia/odynophagia, SOB with lying down; she c/o: - cannot lose weight >> trying to exercise but does feel nausea and exhaustion after 15 min >> takes the whole rest of the day to recover - + palpitations >> for 6 months - + excessive sweating/heat intolerance alternating with feeling cold - does not sleep well at night - + tremors - + anxiety - swelling in face and shoulders and also in hands/feet - + occasional fatigue - + hair loss - no hyperdefecation; + constipation - always  Pt does have a FH of thyroid ds in great aunts. No FH of thyroid cancer. No h/o radiation tx to head or neck.  No seaweed or kelp, no recent contrast studies. No recent steroid use since 02/2013 >> at that time (iv) during the surgery on her cervical spine. In 06/2013 >> had PNA >> had Prednisone. No herbal supplements.   I reviewed her chart and she also has a history of vit D def., DDD.   ROS: Constitutional: see HPI Eyes: + blurry vision, no xerophthalmia ENT: no sore throat, no nodules palpated in throat, no dysphagia/odynophagia, no hoarseness Cardiovascular: no CP/SOB/+ palpitations/+ hand and leg swelling Respiratory: no cough/SOB Gastrointestinal: no N/V/D/C Musculoskeletal: no muscle/joint aches Skin: no rashes, + itching, + hair loss Neurological: no tremors/numbness/tingling/dizziness Psychiatric: no depression/+ anxiety Low  libido  Past Medical History  Diagnosis Date  . Anxiety   . Cancer 2002    melanoma/basal cell  . H/O bladder infections   . Back pain   . Hemorrhoids   . Arthritis   . History of depression   . History of kidney stones    Past Surgical History  Procedure Laterality Date  . Cesarean section  2005  . Foot surgery  2001   History   Social History  . Marital Status: Married   Occupational History  . customer service    Social History Main Topics  . Smoking status: Never Smoker   . Smokeless tobacco: Not on file  . Alcohol Use: Yes     Comment: drink 1-2 glasses of wine twice a year  . Drug Use: No  . Sexual Activity: Yes   Social History Narrative   First day of last period - 06/18/2012   How many days do your periods last - 5   How often are your periods - once a month   How old were you when you started your periods - 43 years old, pain with periods      Number of children - 5: 2-21 y/o   Current Outpatient Prescriptions on File Prior to Visit  Medication Sig Dispense Refill  . cetirizine (ZYRTEC) 10 MG tablet Take 10 mg by mouth daily.      . Cholecalciferol (VITAMIN D3) 2000 UNITS CHEW Chew 1 tablet by mouth daily.  No current facility-administered medications on file prior to visit.   Allergies  Allergen Reactions  . Clarithromycin Nausea And Vomiting  . Ranitidine Hcl Hives and Swelling  . Sulfa Antibiotics Hives  . Zithromax [Azithromycin]     GI   Family History  Problem Relation Age of Onset  . Positive PPD/TB Exposure Neg Hx   . Cancer Mother     breast - left and melanoma  . Hypertension Brother   . Hyperlipidemia Brother   . Cancer Maternal Grandmother     colon and melanoma  . Alcohol abuse Maternal Aunt   . Alcohol abuse Maternal Grandfather   . Arthritis Maternal Grandmother   . Breast cancer Mother   . Hyperlipidemia Mother   . Hyperlipidemia Maternal Grandmother   . Hypertension Maternal Grandmother   . Heart disease Maternal  Grandmother   . Depression Mother   . Depression Father    PE: BP 114/66  Pulse 105  Temp(Src) 98.3 F (36.8 C) (Oral)  Resp 12  Ht 5\' 8"  (1.727 m)  Wt 164 lb 9.6 oz (74.662 kg)  BMI 25.03 kg/m2  SpO2 96%  LMP 09/19/2013 Wt Readings from Last 3 Encounters:  10/30/13 164 lb 9.6 oz (74.662 kg)  10/03/13 162 lb 12 oz (73.823 kg)  07/21/13 161 lb 4 oz (73.143 kg)   Constitutional: overweight, in NAD Eyes: PERRLA, EOMI, no exophthalmos, no lid lag, no stare ENT: moist mucous membranes, no thyromegaly, no thyroid bruits, no cervical lymphadenopathy Cardiovascular: RRR (not tachycardic on exam), No MRG Respiratory: CTA B Gastrointestinal: abdomen soft, NT, ND, BS+ Musculoskeletal: no deformities, strength intact in all 4 Skin: moist, warm, no rashes Neurological: no tremor with outstretched hands, DTR normal in all 4  ASSESSMENT: 1. Thyrotoxycosis  PLAN:  1. Patient with a recently found low TSH, with some thyrotoxic sxs: palpitations, heat intolerance, anxiety.  - she does not appear to have exogenous causes for the low TSH.  - We discussed that possible causes of thyrotoxicosis are:  Graves ds   Thyroiditis toxic multinodular goiter/ toxic adenoma (I cannot feel nodules at palpation of his thyroid). - I suggested that we check the TSH, fT3 and fT4 and also had thyroid stimulating antibodies to screen for Graves' disease.  - If the tests remain abnormal (which I suspect) I will obtain an uptake and scan to differentiate between the 3 above possible etiologies  - we discussed about possible modalities of treatment for the above conditions, to include methimazole use or radioactive iodine ablation. - we might need to do thyroid ultrasound depending on the results of the uptake and scan (if a cold nodule is present) - I advised her to join my chart to communicate easier - RTC in 3 months, but likely sooner for repeat labs  Office Visit on 10/30/2013  Component Date Value Ref  Range Status  . TSH 10/30/2013 0.36  0.35 - 4.50 uIU/mL Final  . Free T4 10/30/2013 0.78  0.60 - 1.60 ng/dL Final  . T3, Free 10/30/2013 3.0  2.3 - 4.2 pg/mL Final   TFTs have normalized. Will repeat tests in 3 mo, if normal, will f/u with PCP and RTC as needed.

## 2013-10-30 NOTE — Patient Instructions (Signed)
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 Please stop at the lab. If the TSH is still low, we may need to check an uptake and scan. I will let you know. Please try to join MyChart for easier communication.  Please return in 3 months.

## 2013-10-31 ENCOUNTER — Encounter: Payer: Self-pay | Admitting: Family Medicine

## 2013-11-01 LAB — THYROID STIMULATING IMMUNOGLOBULIN: TSI: 37 %{baseline} (ref <140)

## 2013-12-09 ENCOUNTER — Ambulatory Visit: Payer: BC Managed Care – PPO | Admitting: Family Medicine

## 2013-12-09 DIAGNOSIS — Z0289 Encounter for other administrative examinations: Secondary | ICD-10-CM

## 2014-01-30 ENCOUNTER — Ambulatory Visit: Payer: BC Managed Care – PPO | Admitting: Internal Medicine

## 2014-01-30 ENCOUNTER — Telehealth: Payer: Self-pay | Admitting: Internal Medicine

## 2014-01-30 NOTE — Telephone Encounter (Signed)
D. Within 3 months

## 2014-01-30 NOTE — Telephone Encounter (Signed)
Patient no showed today's appt. Please advise on how to follow up. °A. No follow up necessary. °B. Follow up urgent. Contact patient immediately. °C. Follow up necessary. Contact patient and schedule visit in ___ days. °D. Follow up advised. Contact patient and schedule visit in ____weeks. ° °

## 2014-02-03 ENCOUNTER — Encounter: Payer: Self-pay | Admitting: Internal Medicine

## 2014-02-03 ENCOUNTER — Other Ambulatory Visit: Payer: Self-pay | Admitting: Internal Medicine

## 2014-02-03 ENCOUNTER — Ambulatory Visit (INDEPENDENT_AMBULATORY_CARE_PROVIDER_SITE_OTHER): Payer: BC Managed Care – PPO | Admitting: Internal Medicine

## 2014-02-03 VITALS — BP 104/62 | HR 87 | Temp 98.2°F | Resp 12 | Wt 164.0 lb

## 2014-02-03 DIAGNOSIS — R7989 Other specified abnormal findings of blood chemistry: Secondary | ICD-10-CM

## 2014-02-03 DIAGNOSIS — R946 Abnormal results of thyroid function studies: Secondary | ICD-10-CM

## 2014-02-03 DIAGNOSIS — R638 Other symptoms and signs concerning food and fluid intake: Secondary | ICD-10-CM

## 2014-02-03 NOTE — Progress Notes (Signed)
Patient ID: Adrienne Reese, female   DOB: 1970/12/03, 43 y.o.   MRN: [id number]   HPI  Adrienne Reese is a 43 y.o.-year-old female, returning for f/u for subclinical hyperthyroidism. We also addressed her difficulty to lose weight.  Reviewed hx: She presented to her PCP with several sxs in 06/2013 (see below) and a TSH was found to be low - confirmed x1. At last visit, in 11/2013, we checked her TFTs and they normalized:  Lab Results  Component Value Date   TSH 0.36 10/30/2013   TSH 0.26* 10/03/2013   TSH 0.282* 06/30/2013   TSH 0.473 07/24/2012   FREET4 0.78 10/30/2013   FREET4 0.90 10/03/2013   FREET4 1.13 06/30/2013    Pt denies feeling nodules in neck, hoarseness, dysphagia/odynophagia, SOB with lying down; she c/o: - cannot lose weight >> trying to exercise but does feel nausea and exhaustion after 15 min >> takes the whole rest of the day to recover - less palpitations  - excessive sweating/heat intolerance alternating with feeling cold - + tremors - + anxiety - + occasional fatigue, but not significant - + hair loss  She also has a history of vit D def., DDD.   ROS: Constitutional: see HPI Eyes: no blurry vision, no xerophthalmia ENT: no sore throat, no nodules palpated in throat, no dysphagia/odynophagia, no hoarseness Cardiovascular: + CP with deep breaths/SOB/less palpitations/no leg swelling Respiratory: no cough/SOB Gastrointestinal: no N/V/D/C Musculoskeletal: no muscle/+ joint aches Skin: no rashes, + hair loss Neurological: no tremors/numbness/tingling/dizziness Psychiatric: no depression/+ anxiety  I reviewed pt's medications, allergies, PMH, social hx, family hx and no changes required, except as mentioned above.  PE: BP 104/62  Pulse 87  Temp(Src) 98.2 F (36.8 C) (Oral)  Resp 12  Wt 164 lb (74.39 kg)  SpO2 98% Body mass index is 24.94 kg/(m^2).  Wt Readings from Last 3 Encounters:  02/03/14 164 lb (74.39 kg)  10/30/13 164 lb 9.6 oz (74.662 kg)   10/03/13 162 lb 12 oz (73.823 kg)   Constitutional: overweight, in NAD Eyes: PERRLA, EOMI, no exophthalmos, no lid lag, no stare ENT: moist mucous membranes, no thyromegaly, no cervical lymphadenopathy Cardiovascular: RRR, No MRG Respiratory: CTA B Gastrointestinal: abdomen soft, NT, ND, BS+ Musculoskeletal: no deformities, strength intact in all 4 Skin: moist, warm, no rashes Neurological: no tremor with outstretched hands, DTR normal in all 4  ASSESSMENT: 1. Subclinical hyperthyroidism - resolved  2. Inability to lose weight  PLAN:  1. Patient with h/o low TSH x 2 this spring, with some thyrotoxic sxs: palpitations, heat intolerance, anxiety, however, with TFT normalizing 3 mo ago.She c/o less sxs c/w last time.   - we will recheck TFTs today: TSH, free t3, free t4 - We discussed again that possible causes of thyrotoxicosis are:  Graves ds   Thyroiditis toxic multinodular goiter/ toxic adenoma  - If the tests remain abnormal, we may need an uptake and scan to differentiate between the 3 above possible etiologies  - RTC in 6 months if the TFTs are abnormal. If they are normal >> she can continue to follow up with PCP yearly and RTC as needed  2. Inability to lose weight - cannot exercise as she starts feeling faint after even minimal effort - dicussed at length diet and suggested a vegan diet - given references - see pt instructions. She would like to try it.  Orders Only on 02/03/2014  Component Date Value Ref Range Status  . TSH 02/03/2014 0.490  0.350 -  4.500 uIU/mL Final  . Free T4 02/03/2014 1.04  0.80 - 1.80 ng/dL Final  . T3, Free 02/03/2014 3.4  2.3 - 4.2 pg/mL Final   Msg sent:  Dear Ms Adrienne Reese, The thyroid tests are great. We can cancel the next appt and have you followed by PCP as discussed.  Sincerely, Philemon Kingdom MD

## 2014-02-03 NOTE — Patient Instructions (Signed)
Please stop at [facility] lab downstairs.  Please consider the following ways to cut down carbs and fat and increase fiber and micronutrients in your diet: - substitute whole grain for white bread or pasta - substitute brown rice for white rice - substitute 90-calorie flat bread pieces for slices of bread when possible - substitute sweet potatoes or yams for white potatoes - substitute humus for margarine - substitute tofu for cheese when possible - substitute almond or rice milk for regular milk (would not drink soy milk daily due to concern for soy estrogen influence on breast cancer risk) - substitute dark chocolate for other sweets when possible - substitute water - can add lemon or orange slices for taste - for diet sodas (artificial sweeteners will trick your body that you can eat sweets without getting calories and will lead you to overeating and weight gain in the long run) - do not skip breakfast or other meals (this will slow down the metabolism and will result in more weight gain over time)  - can try smoothies made from fruit and almond/rice milk in am instead of regular breakfast - can also try old-fashioned (not instant) oatmeal made with almond/rice milk in am - order the dressing on the side when eating salad at a restaurant (pour less than half of the dressing on the salad) - eat as little meat as possible - can try juicing, but should not forget that juicing will get rid of the fiber, so would alternate with eating raw veg./fruits or drinking smoothies - use as little oil as possible, even when using olive oil - can dress a salad with a mix of balsamic vinegar and lemon juice, for e.g. - use agave nectar, stevia sugar, or regular sugar rather than artificial sweateners - steam or broil/roast veggies  - snack on veggies/fruit/nuts (unsalted, preferably) when possible, rather than processed foods - reduce or eliminate aspartame in diet (it is in diet sodas, chewing gum, etc) Read  the labels!  Try to read Dr. Janene Harvey book: "Program for Reversing Diabetes" for the vegan concept and other ideas for healthy eating.  Plant-based diet materials: - Lectures (you tube):  Alyssa Grove: "Breaking the Food Seduction"  Doug Lisle: "How to Lose Weight, without Losing Your Mind" - Documentaries:  [place] over [employer], Sick and Nearly Dead  The [employer]  Overweight and undernourished - Books:  Alyssa Grove: "Program for Reversing Diabetes"  Heath Gold: "The [place] Study"  Norma Fredrickson: "Supermarket Vegan" (cookbook) - Facebook pages:   Moshe Salisbury versus Knives  Vegucated  [place] Matters - Healthy nutrition info websites:  https://www.martin.info/

## 2014-02-04 LAB — T3, FREE: T3, Free: 3.4 pg/mL (ref 2.3–4.2)

## 2014-02-04 LAB — T4, FREE: Free T4: 1.04 ng/dL (ref 0.80–1.80)

## 2014-02-04 LAB — TSH: TSH: 0.49 u[IU]/mL (ref 0.350–4.500)

## 2014-02-18 ENCOUNTER — Ambulatory Visit (INDEPENDENT_AMBULATORY_CARE_PROVIDER_SITE_OTHER): Payer: BC Managed Care – PPO | Admitting: Gynecology

## 2014-02-18 ENCOUNTER — Encounter: Payer: Self-pay | Admitting: Gynecology

## 2014-02-18 VITALS — BP 110/76 | HR 80 | Resp 12 | Ht 67.5 in | Wt 160.0 lb

## 2014-02-18 DIAGNOSIS — Z800 Family history of malignant neoplasm of digestive organs: Secondary | ICD-10-CM

## 2014-02-18 DIAGNOSIS — Z803 Family history of malignant neoplasm of breast: Secondary | ICD-10-CM

## 2014-02-18 DIAGNOSIS — F32A Depression, unspecified: Secondary | ICD-10-CM

## 2014-02-18 DIAGNOSIS — F329 Major depressive disorder, single episode, unspecified: Secondary | ICD-10-CM

## 2014-02-18 DIAGNOSIS — Z01419 Encounter for gynecological examination (general) (routine) without abnormal findings: Secondary | ICD-10-CM

## 2014-02-18 DIAGNOSIS — N921 Excessive and frequent menstruation with irregular cycle: Secondary | ICD-10-CM

## 2014-02-18 DIAGNOSIS — Z124 Encounter for screening for malignant neoplasm of cervix: Secondary | ICD-10-CM

## 2014-02-18 DIAGNOSIS — N920 Excessive and frequent menstruation with regular cycle: Secondary | ICD-10-CM

## 2014-02-18 DIAGNOSIS — F3289 Other specified depressive episodes: Secondary | ICD-10-CM

## 2014-02-18 MED ORDER — CITALOPRAM HYDROBROMIDE 20 MG PO TABS: 20.0000 mg | ORAL_TABLET | Freq: Every day | ORAL | Status: DC

## 2014-02-18 NOTE — Progress Notes (Signed)
43 y.o. Married Caucasian female   [place] here for annual exam. Pt is currently sexually active.  Pt reports that her cycle has changed, can come a week earlier, seems closer but did not track, heavier, crampy, low back pain and clots-up to kiwi size.  Change has occurred 2y ago, pt had an Essure done about 43m or less before.  Pt reports negative HSG after procedure.  Breastfed only 42m.  Weight has been fluctuating. Pt also reports some emotional lability, for a few month, new job, now full time.  Pt has a family history of depression, she took celexa after her father committed suicide, but self weaned herself.   Pt does have issues with insomnia.    Patient's last menstrual period was 02/04/2014.          Sexually active: Yes.    The current method of family planning is Essure.    Exercising: No.  The patient does not participate in regular exercise at present. Last pap: 2012- Normal  Alcohol: 2 drinks every few weeks Tobacco: no BSE: no Mammogram:  5 years ago-Normal  Labs: Loura Pardon, MD    Health Maintenance  Topic Date Due  . Pap Smear  06/05/2013  . Influenza Vaccine  01/03/2014  . Tetanus/tdap  10/01/2020    Family History  Problem Relation Age of Onset  . Positive PPD/TB Exposure Neg Hx   . Cancer Mother     breast - left and melanoma  . Hypertension Brother   . Hyperlipidemia Brother   . Cancer Maternal Grandmother     colon and melanoma  . Alcohol abuse Maternal Aunt   . Alcohol abuse Maternal Grandfather   . Arthritis Maternal Grandmother   . Breast cancer Mother   . Hyperlipidemia Mother   . Hyperlipidemia Maternal Grandmother   . Hypertension Maternal Grandmother   . Heart disease Maternal Grandmother   . Depression Mother   . Depression Father   . Fibroids Mother   . Heart attack Maternal Grandmother   . Osteoporosis Maternal Grandmother     Patient Active Problem List   Diagnosis Date Noted  . Low TSH level 10/09/2013  . Menorrhagia 10/03/2013  .  Other malaise and fatigue 10/03/2013  . Anxiety 10/03/2013  . Exercise intolerance 10/03/2013  . Seborrheic dermatitis 07/21/2013  . Hair loss 07/21/2013  . History of neck surgery 03/16/2013  . Papule 03/14/2013  . Unspecified vitamin D deficiency 02/17/2013  . Spinal stenosis in cervical region 02/17/2013  . DDD (degenerative disc disease), cervical 07/26/2012  . Family history of breast cancer in mother 07/24/2012    Past Medical History  Diagnosis Date  . Anxiety   . H/O bladder infections   . Back pain   . Hemorrhoids   . Arthritis   . History of depression   . History of kidney stones   . Anemia   . Cancer 2002    melanoma/basal cell  . Dyspareunia     Past Surgical History  Procedure Laterality Date  . Cesarean section  2005  . Foot surgery  2001  . Cervical spine surgery  02/2013    Allergies: Clarithromycin; Ranitidine hcl; Sulfa antibiotics; and Zithromax  Current Outpatient Prescriptions  Medication Sig Dispense Refill  . cetirizine (ZYRTEC) 10 MG tablet Take 10 mg by mouth daily.      . Cholecalciferol (VITAMIN D3) 2000 UNITS CHEW Chew 1 tablet by mouth daily.       No current facility-administered medications for this visit.  ROS: Pertinent items are noted in HPI.  Exam:    BP 110/76  Pulse 80  Resp 12  Ht 5' 7.5" (1.715 m)  Wt 160 lb (72.576 kg)  BMI 24.68 kg/m2  LMP 02/04/2014 Weight change: @WEIGHTCHANGE @ Last 3 height recordings:  Ht Readings from Last 3 Encounters:  02/18/14 5' 7.5" (1.715 m)  10/30/13 5\' 8"  (1.727 m)  10/03/13 5\' 8"  (1.727 m)   General appearance: alert, cooperative and appears stated age Head: Normocephalic, without obvious abnormality, atraumatic Neck: no adenopathy, no carotid bruit, no JVD, supple, symmetrical, trachea midline and thyroid not enlarged, symmetric, no tenderness/mass/nodules Lungs: clear to auscultation bilaterally Breasts: normal appearance, no masses or tenderness, dense Heart: regular rate and  rhythm, S1, S2 normal, no murmur, click, rub or gallop Abdomen: soft, non-tender; bowel sounds normal; no masses,  no organomegaly Extremities: extremities normal, atraumatic, no cyanosis or edema Skin: Skin color, texture, turgor normal. No rashes or lesions Lymph nodes: Cervical, supraclavicular, and axillary nodes normal. no inguinal nodes palpated Neurologic: Grossly normal   Pelvic: External genitalia:  no lesions              Urethra: normal appearing urethra with no masses, tenderness or lesions              Bartholins and Skenes: Bartholin's, Urethra, Skene's normal                 Vagina: normal appearing vagina with normal color and discharge, no lesions              Cervix: normal appearance              Pap taken: Yes.          Bimanual Exam:  Uterus:  uterus is normal size, shape, consistency and nontender                                      Adnexa:    normal adnexa in size, nontender and no masses                                      Rectovaginal: Confirms                                      Anus:  normal sphincter tone, no lesions       1. Routine gynecological examination counseled on breast self exam, mammography screening, adequate intake of calcium and vitamin D, diet and exercise return annually or prn   2. Screening for cervical cancer Guidelines reviewed - Pap Test with HP (IPS)  3. Family history of Lynch syndrome Up to date handout on Lynch syndrome given to pt- pt with melanoma in 20's, recommend proceeding with part of her evaluation prior to hers and if possible her mother's results back.  Recommend although biracial, children should be seen by dermatology as should pt.  Risks of uterine and ovarian cancer reviewed.  Will triage re colonoscopy after labs - [name] Sonohysterogram; Future - MR Breast Bilateral W Contrast; Future - Endometrial biopsy; Future - Ambulatory referral to Genetics  4. Depression Pt with multiple stressors between her job,  children and other obligations, tolerated celexa in past will start with same dose and  adjust accordingly. - citalopram (CELEXA) 20 MG tablet; Take 1 tablet (20 mg total) by mouth daily. 1/2 tablet for 1w then full  Dispense: 30 tablet; Refill: 12  5. Family history of breast cancer in mother Recommend 3D mammogram as well - MR Breast Bilateral W Contrast; Future - Ambulatory referral to Genetics  6. Menorrhagia with irregular cycle TSH was low, now normal, may be related, but will evaluate based on possible Lynch - [name] Sonohysterogram; Future - Endometrial biopsy; Future   An After Visit Summary was printed and given to the patient.   Additional 24m spent discussing/counseling re Lynch syndrome, >50% face to face

## 2014-02-18 NOTE — Patient Instructions (Signed)
Breast MRI Genetic counselor for Lynch screening Consider 3d mammogram Pelvic u/s for heavy cycles, ovarian evaluation and lining, biopsy Menstrual calendar

## 2014-02-20 LAB — IPS PAP TEST WITH HPV

## 2014-02-25 ENCOUNTER — Telehealth: Payer: Self-pay | Admitting: Gynecology

## 2014-02-25 NOTE — Telephone Encounter (Signed)
Patient returned call. Advised that per benefit quote received, she will be responsible to pay $104.07 when she comes in for SHGM/EMB. Patient agreeable. Scheduled procedure. Advised patient of 72 hour cancellation policy and $505 cancellation fee. Patient agreeable.

## 2014-02-25 NOTE — Telephone Encounter (Signed)
Left message for patient to call back. Need to go over benefits and schedule SHGM/EMB °

## 2014-02-26 ENCOUNTER — Telehealth: Payer: Self-pay | Admitting: Emergency Medicine

## 2014-02-26 ENCOUNTER — Telehealth: Payer: Self-pay | Admitting: Genetic Counselor

## 2014-02-26 DIAGNOSIS — Z803 Family history of malignant neoplasm of breast: Secondary | ICD-10-CM

## 2014-02-26 DIAGNOSIS — Z1239 Encounter for other screening for malignant neoplasm of breast: Secondary | ICD-10-CM

## 2014-02-26 NOTE — Telephone Encounter (Signed)
Patient is scheduled for 3 D screening mammogram at [facility] for 03/19/14 at 1200, arrive at 1145. Detailed message left to advise, okay per designated party release form.  Advised patient to obtain records and call breast center directly with any concerns. Advised after screening mammogram can set up MRI.  Appointment card for [facility] mailed to home address of record.  Message to return call with any questions.  Update routed to Dr. Charlies Constable.

## 2014-02-26 NOTE — Telephone Encounter (Signed)
Left pt vm in ref to genetic appt.

## 2014-02-26 NOTE — Telephone Encounter (Signed)
Calling patient as Dr. Charlies Constable has ordered bilateral Breast MRI with and wo contrast for patient. Materal family hx of breast CA.   Patient will need mammogram within 3 months of scheduling MRI.  No Mammogram on file.  Patient states she had Mammogram approximately 5 years ago in [place]. Unsure of where or when. She will attempt to obtain records from prior primary care.   Patient would like assistance with scheduling 3D screening mammogram at [facility].

## 2014-02-27 ENCOUNTER — Encounter: Payer: BC Managed Care – PPO | Admitting: Gynecology

## 2014-03-02 ENCOUNTER — Telehealth: Payer: Self-pay | Admitting: Gynecology

## 2014-03-02 NOTE — Telephone Encounter (Signed)
Patient returning call.

## 2014-03-02 NOTE — Telephone Encounter (Signed)
Left message for patient to call back. Need to advise of appt at [facility] for genetic counseling Thursday, Oct 01 @ 1230.

## 2014-03-02 NOTE — Telephone Encounter (Signed)
Spoke with patient. Advised of appt. Patient agreeable.

## 2014-03-05 ENCOUNTER — Other Ambulatory Visit: Payer: BC Managed Care – PPO

## 2014-03-12 ENCOUNTER — Other Ambulatory Visit: Payer: BC Managed Care – PPO

## 2014-03-12 ENCOUNTER — Ambulatory Visit (HOSPITAL_BASED_OUTPATIENT_CLINIC_OR_DEPARTMENT_OTHER): Payer: BC Managed Care – PPO | Admitting: Genetic Counselor

## 2014-03-12 DIAGNOSIS — Z800 Family history of malignant neoplasm of digestive organs: Secondary | ICD-10-CM | POA: Insufficient documentation

## 2014-03-12 DIAGNOSIS — Z85820 Personal history of malignant melanoma of skin: Secondary | ICD-10-CM

## 2014-03-12 DIAGNOSIS — Z315 Encounter for genetic counseling: Secondary | ICD-10-CM

## 2014-03-12 DIAGNOSIS — Z809 Family history of malignant neoplasm, unspecified: Secondary | ICD-10-CM

## 2014-03-12 DIAGNOSIS — C439 Malignant melanoma of skin, unspecified: Secondary | ICD-10-CM | POA: Insufficient documentation

## 2014-03-12 DIAGNOSIS — Z803 Family history of malignant neoplasm of breast: Secondary | ICD-10-CM | POA: Insufficient documentation

## 2014-03-12 NOTE — Telephone Encounter (Signed)
Pt cancelled ultrasound appt on 03/17/14. Will call back to reschedule.

## 2014-03-12 NOTE — Progress Notes (Signed)
HISTORY OF PRESENT ILLNESS: Dr. Glori Bickers requested a cancer genetics consultation for Adrienne Reese, a 43 y.o. female, due to a personal and family history of cancer.  Adrienne Reese presents to clinic today to discuss the possibility of a hereditary predisposition to cancer, genetic testing, and to further clarify her future cancer risks, as well as potential cancer risk for family members.    Past Medical History  Diagnosis Date   Anxiety    H/O bladder infections    Back pain    Hemorrhoids    Arthritis    History of depression    History of kidney stones    Anemia    Cancer 2002    Melanoma on right arm at age 78. Fair skin, history of sun poisoning, from [place]   Dyspareunia     Past Surgical History  Procedure Laterality Date   Cesarean section  2005   Foot surgery  2001   Cervical spine surgery  02/2013   Ovaries intact: yes Hysterectomy: no Menopausal status: premenopausal Colonoscopy: no; not examined UTD mammogram: scheduled for one in October 2015 Number of breast biopsies: 0 UTD pelvic exam:  yes  History   Social History   Marital Status: Married    Spouse Name: N/A    Number of Children: N/A   Years of Education: N/A   Occupational History   customer service    Social History Main Topics   Smoking status: Never Smoker    Smokeless tobacco: Not on file   Alcohol Use: Yes     Comment: drink 1-2 glasses of wine twice a year   Drug Use: No   Sexual Activity: Yes    Birth Control/ Protection: Other-see comments     Comment: number of sex partners in the last 12 months 1  use Essure   Other Topics Concern   Not on file   Social History Narrative   First day of last period - 06/18/2012   How many days do your periods last - 5   How often are your periods - once a month   How old were you when you started your periods - 43 years old, pain with periods   Number of pregnancies - 5   Number of children - 5   Number of  miscarriages - 0   Number of abortions - 0                 FAMILY HISTORY:  During the visit, a 4-generation pedigree was obtained. Significant diagnoses include the following:  Family History  Problem Relation Age of Onset   Positive PPD/TB Exposure Neg Hx    Cancer Mother     breast - left dx at 44, 2nd breast cancer diagnosis at 28 (? new primary); and melanoma on right arm at 7.   Breast cancer Mother    Hyperlipidemia Mother    Depression Mother    Fibroids Mother    Hypertension Brother    Hyperlipidemia Brother    Cancer Maternal Grandmother     colon cancer at age 51; and melanoma on left arm at age ?; reportedly told she has Lynch syndrome (no report available today)   Arthritis Maternal Grandmother    Hyperlipidemia Maternal Grandmother    Hypertension Maternal Grandmother    Heart disease Maternal Grandmother    Heart attack Maternal Grandmother    Osteoporosis Maternal Grandmother    Alcohol abuse Maternal Aunt    Alcohol abuse Maternal  Grandfather    Depression Father     Adrienne Reese's ancestry is of Caucasian descent. There is no known Jewish ancestry or consanguinity.  GENETIC COUNSELING ASSESSMENT: Ms. Whitt is a 42 y.o. female with a personal and family history of cancer suggestive of a hereditary predisposition to cancer. Her maternal grandmother was reportedly told she has Lynch syndrome, but a copy of the test report is not available today. The exact Lynch syndrome gene and mutation is unknown by Adrienne Reese. We, therefore, discussed and recommended the following at today's visit.   DISCUSSION: We reviewed the characteristics, features and inheritance patterns of hereditary cancer syndromes. We also discussed genetic testing, including the appropriate family members to test, the process of testing, insurance coverage and turn-around-time for results. We discussed the implications of a negative, positive and/or variant of uncertain  significant result. We recommended Adrienne Reese pursue genetic testing for the CancerNext gene panel which includes testing for genes associated with and increased risk for melanoma, breast cancer, the Lynch syndrome genes, as well as others.   PLAN: Based on our above recommendation, Ms. Dolecki wished to pursue genetic testing and the blood sample was drawn and will be sent to [facility] for analysis. Results should be available within approximately 5 weeks time, at which point they will be disclosed by telephone to Adrienne Reese, as will any additional recommendations warranted by these results. We also encouraged Adrienne Reese to remain in contact with cancer genetics annually so that we can continuously update the family history and inform her of any changes in cancer genetics and testing that may be of benefit for this family. Ms.  Reese questions were answered to her satisfaction today. Our contact information was provided should additional questions or concerns arise.   Thank you for the referral and allowing [name] to share in the care of your patient.   The patient was seen for a total of 55 minutes in face-to-face genetic counseling.  This patient was discussed with Dr. Jana Hakim who agrees with the above.    _______________________________________________________________________ For Office Staff:  Number of people involved in session: 2 Was an Intern/ student involved with case: not applicable

## 2014-03-13 NOTE — Telephone Encounter (Signed)
Dr. Charlies Constable, patient cancelled appointment for [facility] and endometrial biopsy. Prior message is that she will call to r/s.  Ok to close encounter?

## 2014-03-13 NOTE — Telephone Encounter (Signed)
Pt should have these tests due to family history of Lynch syndrome and her bleeding profile. Please call pt and have her reschedule!

## 2014-03-17 ENCOUNTER — Other Ambulatory Visit: Payer: BC Managed Care – PPO

## 2014-03-17 ENCOUNTER — Other Ambulatory Visit: Payer: BC Managed Care – PPO | Admitting: Gynecology

## 2014-03-19 ENCOUNTER — Ambulatory Visit: Payer: BC Managed Care – PPO

## 2014-03-25 ENCOUNTER — Ambulatory Visit
Admission: RE | Admit: 2014-03-25 | Discharge: 2014-03-25 | Disposition: A | Discharge status: Home / Self Care | Payer: BC Managed Care – PPO | Source: Ambulatory Visit | Attending: Gynecology | Admitting: Gynecology

## 2014-03-25 DIAGNOSIS — Z1239 Encounter for other screening for malignant neoplasm of breast: Secondary | ICD-10-CM

## 2014-03-26 NOTE — Telephone Encounter (Signed)
Patient returned call.  She would like to until she has results of her genetic testing blood work to have [place] and endometrial biopsy completed. She states she discussed this with genetic counselor and feels that these tests can wait.   Patient would like to continue with breast MRI scheduling.  Claustrophobia: No High Blood pressure or Diabetes: No Kidney or liver disease:No Allergy to IV contrast:No Insurance Type: Blue [employer]. Patient with hx of Essure implants. Order pending  insurance pre certification at this time.

## 2014-03-26 NOTE — Telephone Encounter (Signed)
Message left to return call to [place] at [phone].   Will give message from Dr. Charlies Constable.  Needs to r/s Sonohysterogram and endometrial biopsy.  Also, will need to schedule breast MRI.

## 2014-04-01 ENCOUNTER — Encounter: Payer: Self-pay | Admitting: Genetic Counselor

## 2014-04-01 DIAGNOSIS — Z803 Family history of malignant neoplasm of breast: Secondary | ICD-10-CM

## 2014-04-01 DIAGNOSIS — C439 Malignant melanoma of skin, unspecified: Secondary | ICD-10-CM

## 2014-04-01 DIAGNOSIS — Z800 Family history of malignant neoplasm of digestive organs: Secondary | ICD-10-CM

## 2014-04-01 NOTE — Progress Notes (Signed)
HPI:  Adrienne Reese was previously seen in the [place] clinic due to a personal and family history of cancer and concerns regarding a hereditary predisposition to cancer. Please refer to our prior cancer genetics clinic note for more information regarding Adrienne Reese's medical, social and family histories, and our assessment and recommendations, at the time. Adrienne Reese recent genetic test results were disclosed to her, as were recommendations warranted by these results. These results and recommendations are discussed in more detail below.  GENETIC TEST RESULTS: At the time of Adrienne Reese's visit, we recommended she pursue genetic testing of the [employer]er[employer] gene panel. This test, which included sequencing and deletion/duplication analysis of the genes, was performed at [facility]. Genetic testing was normal, and did not reveal a deleterious mutation in these genes. A complete list of all genes tested, which includes Lynch syndrome genes, hereditary breast and ovarian cancer genes, melanoma genes, as well as others, is located on the test report scanned into EPIC.    We discussed with Adrienne Reese that since the current genetic testing is not perfect, it is possible there may be a gene mutation in one of these genes that current testing cannot detect, but that chance is small.  We also discussed, that it is possible that another gene that has not yet been discovered, or that we have not yet tested, is responsible for the cancer diagnoses in the family, and it is, therefore, important to remain in touch with cancer genetics in the future so that we can continue to offer Adrienne Reese the most up to date genetic testing.   CANCER SCREENING RECOMMENDATIONS: This result is reassuring and suggests that Adrienne Reese's cancer was most likely not due to an inherited predisposition associated with one of these genes.  Most cancers happen by chance and this negative test suggests that her  cancer falls into this category.  We, therefore, recommended she continue to follow the cancer management and screening guidelines provided by her primary healthcare providers.   RECOMMENDATIONS FOR FAMILY MEMBERS:  While these results are reassuring for Adrienne Reese, this test does not tell us anything about Adrienne Reese maternal half siblings' or maternal relatives' risks. We recommended these relatives also have genetic counseling and testing. Please let us know if we can help facilitate testing. Genetic counselors can be located in other cities, by visiting the website of the [employer] ([employer]) and Field seismologist for a Dietitian by zip code.    FOLLOW-UP: Lastly, we discussed with Adrienne Reese that cancer genetics is a rapidly advancing field and it is possible that new genetic tests will be appropriate for her and/or her family members in the future. We encouraged her to remain in contact with cancer genetics on an annual basis so we can update her personal and family histories and let her know of advances in cancer genetics that may benefit this family.   Our contact number was provided. Adrienne Reese questions were answered to her satisfaction, and she knows she is welcome to call us at anytime with additional questions or concerns.   Catherine A. Fine, MS, CGC Certified Psychologist, sport and [email or web address]

## 2014-04-08 ENCOUNTER — Other Ambulatory Visit: Payer: Self-pay

## 2014-04-08 DIAGNOSIS — F32A Depression, unspecified: Secondary | ICD-10-CM

## 2014-04-08 DIAGNOSIS — F329 Major depressive disorder, single episode, unspecified: Secondary | ICD-10-CM

## 2014-04-08 MED ORDER — CITALOPRAM HYDROBROMIDE 20 MG PO TABS: ORAL_TABLET | ORAL | Status: DC

## 2014-04-08 NOTE — Telephone Encounter (Signed)
Incoming Refill Request from [facility] NM:[id number] 20mg   Last AEX:02/18/14 Last Refill:02/18/14 # 30 X 12 Next AEX:NS  Pt INS is requesting a 90 day supply. Is this ok to refill?  Dr. Charlies Constable pt  Please Advise

## 2014-04-13 NOTE — Telephone Encounter (Signed)
Late Entry for 04/06/14 at 1000: [place] at [phone] to request update for prior authorization. They state that the request for MRI is still pending review for pre-dertermination.

## 2014-04-13 NOTE — Telephone Encounter (Signed)
Called [employer] for update.  Spoke with Judeen Hammans, she states that case has gone to medical review as of this morning, 04/13/14. Will need more time for review.

## 2014-04-21 NOTE — Telephone Encounter (Signed)
Dr. Quincy Simmonds,  Prior patient of Dr. Charlies Constable.  Received denial for bilateral breast MRI from [facility]. Patient has had genetic testing and results in EPIC.  Will advise patient of denial of prior authorization. If patient has questions about ongoing screening, offer office visit with you to discuss? 3D mammogram done on 03/25/14, [place] [facility].

## 2014-04-22 NOTE — Telephone Encounter (Signed)
Spoke with patient and message from Dr. Quincy Simmonds given.  Patient is agreeable to all plan of care. Denies any abnormal uterine bleeding.  Advised denial of MRI and patient understands.  She will call back with any changes in health status and also call back with any changes in family history. She appreciates the update from Dr. Quincy Simmonds.  Routing to provider for final review. Patient agreeable to disposition. Will close encounter

## 2014-04-22 NOTE — Telephone Encounter (Signed)
Patient has tested negative for genetic mutations which increase family risk of cancer.  [facility] recommending routine cancer screening guidelines.  She has had a normal mammogram.  No MRI is needed.  No endometrial biopsy is needed unless patient is having abnormal uterine bleeding.  Colonoscopy screening will be done at age 43 unless she has a family history of someone with colon cancer.  (I do not recall seeing this in her history.) For any further questions, I am happy to have her come see me for consultation!

## 2014-04-24 ENCOUNTER — Encounter: Payer: Self-pay | Admitting: Oncology

## 2014-06-24 ENCOUNTER — Encounter: Payer: Self-pay | Admitting: Family Medicine

## 2014-06-24 ENCOUNTER — Ambulatory Visit (INDEPENDENT_AMBULATORY_CARE_PROVIDER_SITE_OTHER): Payer: BLUE CROSS/BLUE SHIELD | Admitting: Family Medicine

## 2014-06-24 VITALS — BP 114/72 | HR 89 | Temp 98.2°F | Ht 67.5 in | Wt 169.1 lb

## 2014-06-24 DIAGNOSIS — F4323 Adjustment disorder with mixed anxiety and depressed mood: Secondary | ICD-10-CM

## 2014-06-24 MED ORDER — ESCITALOPRAM OXALATE 20 MG PO TABS: 20.0000 mg | ORAL_TABLET | Freq: Every day | ORAL | Status: DC

## 2014-06-24 NOTE — Progress Notes (Signed)
Pre visit review using our clinic review tool, if applicable. No additional management support is needed unless otherwise documented below in the visit note. 

## 2014-06-24 NOTE — Patient Instructions (Signed)
We will call about a referral to a counselor  Stop celexa and start lexapro 20 mg daily (start tomorrow)  If any intolerable side effects or if you feel worse or suicidal - please call asap / stop medicine if you need to  Follow up with me in 4-6 weeks    Take care of yourself  Stay social and active

## 2014-06-24 NOTE — Progress Notes (Signed)
Subjective:    Patient ID: Adrienne Reese, female    DOB: 1970/08/01, 44 y.o.   MRN: [id number]  HPI Here for mood issues/ concerns about depression   Martin Majestic to gyn for her yearly check up in July - and she had "a meltdown" We had suggested counseling in May and she declined  Her gyn started her on celexa 20 mg -- it really helped initially (felt better and she coped better) This past month- mood got worse again  This affects concentration and memory  Not sleeping well (hard time falling asleep and staying asleep)  Really tired as well  Anhedonia  Lack of motivation     Lots of stressors -her husband moved out of the house - relationship is up in the air  A very stressful marriage - her husband has a lot of issues  Not abusive however  Taking care of 4 kids on her own (one is a step son)  Job is very stressful - however she got a job offer today that is excited   She does not have hx of dep/anx as a child  fam hx is strong - her father committed suicide - took celexa for a year in 2011  Did counseling then and she had OCD issues at that time that are a lot better      She also snores more (sleeps with her mouth open)  No known apnea   Wt is up 9 lb    Had foot surgery to fuse joint in L foot (old injury)   Patient Active Problem List   Diagnosis Date Noted  . Melanoma of skin 03/12/2014  . Family history of breast cancer 03/12/2014  . Family history of colon cancer 03/12/2014  . Family history of Lynch syndrome 02/18/2014  . Low TSH level 10/09/2013  . Menorrhagia 10/03/2013  . Other malaise and fatigue 10/03/2013  . Anxiety 10/03/2013  . Exercise intolerance 10/03/2013  . Seborrheic dermatitis 07/21/2013  . Hair loss 07/21/2013  . History of neck surgery 03/16/2013  . Papule 03/14/2013  . Unspecified vitamin D deficiency 02/17/2013  . Spinal stenosis in cervical region 02/17/2013  . DDD (degenerative disc disease), cervical 07/26/2012  . Family history  of breast cancer in mother 07/24/2012   Past Medical History  Diagnosis Date  . Anxiety   . H/O bladder infections   . Back pain   . Hemorrhoids   . Arthritis   . History of depression   . History of kidney stones   . Anemia   . Cancer 2002    melanoma/basal cell  . Dyspareunia    Past Surgical History  Procedure Laterality Date  . Cesarean section  2005  . Foot surgery  2001  . Cervical spine surgery  02/2013   History  Substance Use Topics  . Smoking status: Never Smoker   . Smokeless tobacco: Not on file  . Alcohol Use: 0.0 oz/week    0 Not specified per week     Comment: drink 1-2 glasses of wine twice a year   Family History  Problem Relation Age of Onset  . Positive PPD/TB Exposure Neg Hx   . Cancer Mother     breast - left dx at 29, 2nd breast cancer diagnosis at 78 (? new primary); and melanoma on right arm at 23.  . Breast cancer Mother   . Hyperlipidemia Mother   . Depression Mother   . Fibroids Mother   . Hypertension Brother   .  Hyperlipidemia Brother   . Cancer Maternal Grandmother     colon cancer at age 22; and melanoma on left arm at age ?; reportedly told she has Lynch syndrome (no report available today)  . Arthritis Maternal Grandmother   . Hyperlipidemia Maternal Grandmother   . Hypertension Maternal Grandmother   . Heart disease Maternal Grandmother   . Heart attack Maternal Grandmother   . Osteoporosis Maternal Grandmother   . Alcohol abuse Maternal Aunt   . Alcohol abuse Maternal Grandfather   . Depression Father    Allergies  Allergen Reactions  . Clarithromycin Nausea And Vomiting  . Ranitidine Hcl Hives and Swelling  . Sulfa Antibiotics Hives  . Zithromax [Azithromycin]     GI   Current Outpatient Prescriptions on File Prior to Visit  Medication Sig Dispense Refill  . cetirizine (ZYRTEC) 10 MG tablet Take 10 mg by mouth daily.    . Cholecalciferol (VITAMIN D3) 2000 UNITS CHEW Chew 1 tablet by mouth daily.    . citalopram  (CELEXA) 20 MG tablet 1 tablet ( 20 mg) by mouth daily. 90 tablet 2   No current facility-administered medications on file prior to visit.    Review of Systems Review of Systems  Constitutional: Negative for fever, appetite change, fatigue and unexpected weight change.  Eyes: Negative for pain and visual disturbance.  Respiratory: Negative for cough and shortness of breath.   Cardiovascular: Negative for cp or palpitations    Gastrointestinal: Negative for nausea, diarrhea and constipation.  Genitourinary: Negative for urgency and frequency.  Skin: Negative for pallor or rash   Neurological: Negative for weakness, light-headedness, numbness and headaches.  Hematological: Negative for adenopathy. Does not bruise/bleed easily.  Psychiatric/Behavioral: pos for symptoms of dep and anxiety and neg for SI       Objective:   Physical Exam  Constitutional: She appears well-developed and well-nourished. No distress.  HENT:  Head: Normocephalic and atraumatic.  Mouth/Throat: Oropharynx is clear and moist.  Eyes: Conjunctivae and EOM are normal. Pupils are equal, round, and reactive to light. No scleral icterus.  Neck: Normal range of motion. Neck supple.  Cardiovascular: Normal rate and regular rhythm.   Pulmonary/Chest: Effort normal and breath sounds normal.  Lymphadenopathy:    She has no cervical adenopathy.  Neurological: She is alert. She has normal reflexes. She displays no tremor.  Skin: Skin is warm and dry. No rash noted. No erythema. No pallor.  Psychiatric: Her speech is normal and behavior is normal. Thought content normal. Her mood appears anxious. Her affect is not blunt, not labile and not inappropriate. Thought content is not paranoid. She exhibits a depressed mood. She expresses no homicidal and no suicidal ideation.  Tearful at times  talks readily about stressors           Assessment & Plan:   Problem List Items Addressed This Visit      Other   Adjustment  disorder with mixed anxiety and depressed mood - Primary    Initially improved with celexa 20 but then worse again after increased stressors  Reviewed stressors/ coping techniques/symptoms/ support sources/ tx options and side effects in detail today  Will change celexa to lexapro at 20 mg dose  Disc poss side eff incl worse dep or SI in detail  Pt voiced understanding and will stop med/ update/seek care if needed F/u planned Self care discussed >25 minutes spent in face to face time with patient, >50% spent in counselling or coordination of care  Relevant Orders   Ambulatory referral to Psychology

## 2014-06-26 NOTE — Assessment & Plan Note (Signed)
Initially improved with celexa 20 but then worse again after increased stressors  Reviewed stressors/ coping techniques/symptoms/ support sources/ tx options and side effects in detail today  Will change celexa to lexapro at 20 mg dose  Disc poss side eff incl worse dep or SI in detail  Pt voiced understanding and will stop med/ update/seek care if needed F/u planned Self care discussed >25 minutes spent in face to face time with patient, >50% spent in counselling or coordination of care

## 2014-07-06 ENCOUNTER — Encounter: Payer: Self-pay | Admitting: Family Medicine

## 2014-07-06 ENCOUNTER — Ambulatory Visit (INDEPENDENT_AMBULATORY_CARE_PROVIDER_SITE_OTHER): Payer: BLUE CROSS/BLUE SHIELD | Admitting: Family Medicine

## 2014-07-06 VITALS — BP 112/72 | HR 97 | Temp 98.1°F | Ht 67.5 in | Wt 168.1 lb

## 2014-07-06 DIAGNOSIS — R350 Frequency of micturition: Secondary | ICD-10-CM

## 2014-07-06 DIAGNOSIS — N390 Urinary tract infection, site not specified: Secondary | ICD-10-CM | POA: Insufficient documentation

## 2014-07-06 DIAGNOSIS — N3000 Acute cystitis without hematuria: Secondary | ICD-10-CM

## 2014-07-06 LAB — POCT URINALYSIS DIPSTICK
Bilirubin, UA: NEGATIVE
Blood, UA: 10
Glucose, UA: NEGATIVE
Ketones, UA: NEGATIVE
Nitrite, UA: NEGATIVE
PH UA: 6
Spec Grav, UA: 1.025
Urobilinogen, UA: 0.2

## 2014-07-06 MED ORDER — CIPROFLOXACIN HCL 250 MG PO TABS: 250.0000 mg | ORAL_TABLET | Freq: Two times a day (BID) | ORAL | Status: DC

## 2014-07-06 NOTE — Progress Notes (Signed)
Pre visit review using our clinic review tool, if applicable. No additional management support is needed unless otherwise documented below in the visit note. 

## 2014-07-06 NOTE — Patient Instructions (Signed)
Take cipro as directed  We will do a urine culture and update you  Drink lots of water  If your symptoms worsen please let me know

## 2014-07-06 NOTE — Progress Notes (Signed)
Subjective:    Patient ID: Adrienne Reese, female    DOB: 04-Jun-1971, 44 y.o.   MRN: [id number]  HPI Here with urinary symptoms  Urgency /frequency  Bad odor to urine Low abd pain  Burns to urinate  No blood in urine  No fever   Started this weekend   More tub baths lately   Results for orders placed or performed in visit on 07/06/14  POCT urinalysis dipstick  Result Value Ref Range   Color, UA yellow    Clarity, UA cloudy    Glucose, UA neg    Bilirubin, UA neg    Ketones, UA neg    Spec Grav, UA 1.025    Blood, UA 10 ery/uL    pH, UA 6.0    Protein, UA 15mg     Urobilinogen, UA 0.2    Nitrite, UA neg    Leukocytes, UA large (3+)      Patient Active Problem List   Diagnosis Date Noted  . Melanoma of skin 03/12/2014  . Family history of breast cancer 03/12/2014  . Family history of colon cancer 03/12/2014  . Family history of Lynch syndrome 02/18/2014  . Low TSH level 10/09/2013  . Menorrhagia 10/03/2013  . Other malaise and fatigue 10/03/2013  . Adjustment disorder with mixed anxiety and depressed mood 10/03/2013  . Exercise intolerance 10/03/2013  . Seborrheic dermatitis 07/21/2013  . Hair loss 07/21/2013  . History of neck surgery 03/16/2013  . Papule 03/14/2013  . Unspecified vitamin D deficiency 02/17/2013  . Spinal stenosis in cervical region 02/17/2013  . DDD (degenerative disc disease), cervical 07/26/2012  . Family history of breast cancer in mother 07/24/2012   Past Medical History  Diagnosis Date  . Anxiety   . H/O bladder infections   . Back pain   . Hemorrhoids   . Arthritis   . History of depression   . History of kidney stones   . Anemia   . Cancer 2002    melanoma/basal cell  . Dyspareunia    Past Surgical History  Procedure Laterality Date  . Cesarean section  2005  . Foot surgery  2001  . Cervical spine surgery  02/2013   History  Substance Use Topics  . Smoking status: Never Smoker   . Smokeless tobacco: Not on  file  . Alcohol Use: 0.0 oz/week    0 Not specified per week     Comment: drink 1-2 glasses of wine twice a year   Family History  Problem Relation Age of Onset  . Positive PPD/TB Exposure Neg Hx   . Cancer Mother     breast - left dx at 26, 2nd breast cancer diagnosis at 37 (? new primary); and melanoma on right arm at 76.  . Breast cancer Mother   . Hyperlipidemia Mother   . Depression Mother   . Fibroids Mother   . Hypertension Brother   . Hyperlipidemia Brother   . Cancer Maternal Grandmother     colon cancer at age 42; and melanoma on left arm at age ?; reportedly told she has Lynch syndrome (no report available today)  . Arthritis Maternal Grandmother   . Hyperlipidemia Maternal Grandmother   . Hypertension Maternal Grandmother   . Heart disease Maternal Grandmother   . Heart attack Maternal Grandmother   . Osteoporosis Maternal Grandmother   . Alcohol abuse Maternal Aunt   . Alcohol abuse Maternal Grandfather   . Depression Father    Allergies  Allergen Reactions  .  Clarithromycin Nausea And Vomiting  . Ranitidine Hcl Hives and Swelling  . Sulfa Antibiotics Hives  . Zithromax [Azithromycin]     GI   Current Outpatient Prescriptions on File Prior to Visit  Medication Sig Dispense Refill  . cetirizine (ZYRTEC) 10 MG tablet Take 10 mg by mouth daily.    . Cholecalciferol (VITAMIN D3) 2000 UNITS CHEW Chew 1 tablet by mouth daily.    Marland Kitchen escitalopram (LEXAPRO) 20 MG tablet Take 1 tablet (20 mg total) by mouth daily. 30 tablet 5   No current facility-administered medications on file prior to visit.    Review of Systems    Review of Systems  Constitutional: Negative for fever, appetite change, fatigue and unexpected weight change.  Eyes: Negative for pain and visual disturbance.  Respiratory: Negative for cough and shortness of breath.   Cardiovascular: Negative for cp or palpitations    Gastrointestinal: Negative for nausea, diarrhea and constipation.    Genitourinary: pos for urgency and frequency. neg for hematuria  Skin: Negative for pallor or rash   Neurological: Negative for weakness, light-headedness, numbness and headaches.  Hematological: Negative for adenopathy. Does not bruise/bleed easily.  Psychiatric/Behavioral: Negative for dysphoric mood. The patient is not nervous/anxious.      Objective:   Physical Exam  Constitutional: She appears well-developed and well-nourished. No distress.  HENT:  Head: Normocephalic and atraumatic.  Mouth/Throat: Oropharynx is clear and moist.  Eyes: Conjunctivae and EOM are normal. Pupils are equal, round, and reactive to light. No scleral icterus.  Neck: Normal range of motion. Neck supple.  Cardiovascular: Normal rate, regular rhythm and normal heart sounds.   Pulmonary/Chest: Effort normal and breath sounds normal.  Abdominal: Soft. Bowel sounds are normal. She exhibits no distension and no mass. There is no hepatosplenomegaly. There is tenderness in the suprapubic area. There is no rebound, no guarding and no CVA tenderness.  Lymphadenopathy:    She has no cervical adenopathy.  Neurological: She is alert.  Skin: Skin is warm and dry. No rash noted.  Psychiatric: She has a normal mood and affect.          Assessment & Plan:   Problem List Items Addressed This Visit      Genitourinary   UTI (urinary tract infection) - Primary    Cover with cipro  Urine sent for cx  Disc water intake  Update if not starting to improve in several days  or if worsening        Relevant Orders   Urine culture    Other Visit Diagnoses    Urinary frequency        Relevant Orders    POCT urinalysis dipstick (Completed)

## 2014-07-06 NOTE — Assessment & Plan Note (Signed)
Cover with cipro  Urine sent for cx  Disc water intake  Update if not starting to improve in several days  or if worsening

## 2014-07-08 LAB — URINE CULTURE

## 2014-07-09 ENCOUNTER — Ambulatory Visit (INDEPENDENT_AMBULATORY_CARE_PROVIDER_SITE_OTHER): Payer: BLUE CROSS/BLUE SHIELD | Admitting: Psychology

## 2014-07-09 DIAGNOSIS — F4323 Adjustment disorder with mixed anxiety and depressed mood: Secondary | ICD-10-CM

## 2014-07-22 ENCOUNTER — Ambulatory Visit (INDEPENDENT_AMBULATORY_CARE_PROVIDER_SITE_OTHER): Payer: BLUE CROSS/BLUE SHIELD | Admitting: Family Medicine

## 2014-07-22 ENCOUNTER — Encounter: Payer: Self-pay | Admitting: Family Medicine

## 2014-07-22 VITALS — BP 112/70 | HR 77 | Temp 98.2°F | Wt 170.4 lb

## 2014-07-22 DIAGNOSIS — F4323 Adjustment disorder with mixed anxiety and depressed mood: Secondary | ICD-10-CM

## 2014-07-22 NOTE — Patient Instructions (Signed)
Do some research on counselors in [place]- let me know who you want to see and I will do the referral  I'm glad you are doing better  Get back to exercise when you can

## 2014-07-22 NOTE — Progress Notes (Signed)
Subjective:    Patient ID: Adrienne Reese, female    DOB: [birth year], 44 y.o.   MRN: [id number]  HPI Here for f/u of mood problems  Last time - we changed celexa to lexapro   Her mood is improved overall - this is working better than the celexa  Stress is worse - but she is handling it better  Went to counselor we referred her to - had one visit  Has a new job in [place] -is hard for her to miss work -cannot leave work to go   Now she wants to work on [employer] loss   Not tearful  Not having trouble getting through the day  Still not sleeping very well-this is not new (had that problem for a while)  She is recovering from foot surgery and then will start working out in the evening -that will help too  Cut out soda  Cut out sweet tea  Drinking lots of water  She is more motivated   Patient Active Problem List   Diagnosis Date Noted  . UTI (urinary tract infection) 07/06/2014  . Melanoma of skin 03/12/2014  . Family history of breast cancer 03/12/2014  . Family history of colon cancer 03/12/2014  . Family history of Lynch syndrome 02/18/2014  . Low TSH level 10/09/2013  . Menorrhagia 10/03/2013  . Other malaise and fatigue 10/03/2013  . Adjustment disorder with mixed anxiety and depressed mood 10/03/2013  . Exercise intolerance 10/03/2013  . Seborrheic dermatitis 07/21/2013  . Hair loss 07/21/2013  . History of neck surgery 03/16/2013  . Papule 03/14/2013  . Unspecified vitamin D deficiency 02/17/2013  . Spinal stenosis in cervical region 02/17/2013  . DDD (degenerative disc disease), cervical 07/26/2012  . Family history of breast cancer in mother 07/24/2012   Past Medical History  Diagnosis Date  . Anxiety   . H/O bladder infections   . Back pain   . Hemorrhoids   . Arthritis   . History of depression   . History of kidney stones   . Anemia   . Cancer 2002    melanoma/basal cell  . Dyspareunia    Past Surgical History  Procedure Laterality Date  .  Cesarean section  2005  . Foot surgery  2001  . Cervical spine surgery  02/2013   History  Substance Use Topics  . Smoking status: Never Smoker   . Smokeless tobacco: Not on file  . Alcohol Use: 0.0 oz/week    0 Standard drinks or equivalent per week     Comment: drink 1-2 glasses of wine twice a year   Family History  Problem Relation Age of Onset  . Positive PPD/TB Exposure Neg Hx   . Cancer Mother     breast - left dx at 41, 2nd breast cancer diagnosis at 64 (? new primary); and melanoma on right arm at 4.  . Breast cancer Mother   . Hyperlipidemia Mother   . Depression Mother   . Fibroids Mother   . Hypertension Brother   . Hyperlipidemia Brother   . Cancer Maternal Grandmother     colon cancer at age 45; and melanoma on left arm at age ?; reportedly told she has Lynch syndrome (no report available today)  . Arthritis Maternal Grandmother   . Hyperlipidemia Maternal Grandmother   . Hypertension Maternal Grandmother   . Heart disease Maternal Grandmother   . Heart attack Maternal Grandmother   . Osteoporosis Maternal Grandmother   . Alcohol abuse  Maternal Aunt   . Alcohol abuse Maternal Grandfather   . Depression Father    Allergies  Allergen Reactions  . Clarithromycin Nausea And Vomiting  . Ranitidine Hcl Hives and Swelling  . Sulfa Antibiotics Hives  . Zithromax [Azithromycin]     GI   Current Outpatient Prescriptions on File Prior to Visit  Medication Sig Dispense Refill  . cetirizine (ZYRTEC) 10 MG tablet Take 10 mg by mouth daily.    . Cholecalciferol (VITAMIN D3) 2000 UNITS CHEW Chew 1 tablet by mouth daily.    Marland Kitchen escitalopram (LEXAPRO) 20 MG tablet Take 1 tablet (20 mg total) by mouth daily. 30 tablet 5   No current facility-administered medications on file prior to visit.        Review of Systems Review of Systems  Constitutional: Negative for fever, appetite change,  and unexpected weight change.  Eyes: Negative for pain and visual disturbance.    Respiratory: Negative for cough and shortness of breath.   Cardiovascular: Negative for cp or palpitations    Gastrointestinal: Negative for nausea, diarrhea and constipation.  Genitourinary: Negative for urgency and frequency.  Skin: Negative for pallor or rash   Neurological: Negative for weakness, light-headedness, numbness and headaches.  Hematological: Negative for adenopathy. Does not bruise/bleed easily.  Psychiatric/Behavioral: Negative for dysphoric mood. The patient is not nervous/anxious.  (mood is improved)       Objective:   Physical Exam  Constitutional: She appears well-developed and well-nourished. No distress.  HENT:  Head: Normocephalic and atraumatic.  Mouth/Throat: Oropharynx is clear and moist.  Eyes: Conjunctivae and EOM are normal. Pupils are equal, round, and reactive to light. No scleral icterus.  Neck: Normal range of motion. Neck supple. No thyromegaly present.  Cardiovascular: Normal rate and regular rhythm.   Pulmonary/Chest: Effort normal and breath sounds normal.  Lymphadenopathy:    She has no cervical adenopathy.  Neurological: She has normal reflexes. She exhibits normal muscle tone.  No tremor   Skin: Skin is warm and dry. No rash noted. No erythema. No pallor.  Psychiatric: She has a normal mood and affect.          Assessment & Plan:   Problem List Items Addressed This Visit      Other   Adjustment disorder with mixed anxiety and depressed mood - Primary    Improved with lexapro (change from celexa)  Reviewed stressors/ coping techniques/symptoms/ support sources/ tx options and side effects in detail today She would like to get a counselor closer to her with more flexible hours- she will check into coverage for someone in [place] and get back to [name]

## 2014-07-22 NOTE — Progress Notes (Signed)
Pre visit review using our clinic review tool, if applicable. No additional management support is needed unless otherwise documented below in the visit note. 

## 2014-07-23 NOTE — Assessment & Plan Note (Signed)
Improved with lexapro (change from celexa)  Reviewed stressors/ coping techniques/symptoms/ support sources/ tx options and side effects in detail today She would like to get a counselor closer to her with more flexible hours- she will check into coverage for someone in [place] and get back to [name]

## 2014-07-29 ENCOUNTER — Ambulatory Visit: Payer: BLUE CROSS/BLUE SHIELD | Admitting: Family Medicine

## 2014-08-03 ENCOUNTER — Encounter: Payer: Self-pay | Admitting: Family Medicine

## 2014-08-04 ENCOUNTER — Ambulatory Visit: Payer: BC Managed Care – PPO | Admitting: Internal Medicine

## 2014-08-13 ENCOUNTER — Other Ambulatory Visit: Payer: Self-pay

## 2014-08-13 MED ORDER — ESCITALOPRAM OXALATE 20 MG PO TABS: 20.0000 mg | ORAL_TABLET | Freq: Every day | ORAL | Status: DC

## 2014-09-18 ENCOUNTER — Encounter: Payer: Self-pay | Admitting: Family Medicine

## 2014-09-18 ENCOUNTER — Telehealth: Payer: Self-pay

## 2014-09-18 ENCOUNTER — Ambulatory Visit (INDEPENDENT_AMBULATORY_CARE_PROVIDER_SITE_OTHER): Payer: BLUE CROSS/BLUE SHIELD | Admitting: Family Medicine

## 2014-09-18 VITALS — BP 100/62 | HR 87 | Temp 97.8°F | Ht 67.5 in | Wt 172.1 lb

## 2014-09-18 DIAGNOSIS — R0683 Snoring: Secondary | ICD-10-CM | POA: Insufficient documentation

## 2014-09-18 DIAGNOSIS — R5382 Chronic fatigue, unspecified: Secondary | ICD-10-CM | POA: Diagnosis not present

## 2014-09-18 DIAGNOSIS — E559 Vitamin D deficiency, unspecified: Secondary | ICD-10-CM

## 2014-09-18 DIAGNOSIS — E538 Deficiency of other specified B group vitamins: Secondary | ICD-10-CM

## 2014-09-18 LAB — CBC WITH DIFFERENTIAL/PLATELET
BASOS PCT: 0.6 % (ref 0.0–3.0)
Basophils Absolute: 0 10*3/uL (ref 0.0–0.1)
EOS PCT: 1.5 % (ref 0.0–5.0)
Eosinophils Absolute: 0.1 10*3/uL (ref 0.0–0.7)
HEMATOCRIT: 37.1 % (ref 36.0–46.0)
HEMOGLOBIN: 12.8 g/dL (ref 12.0–15.0)
LYMPHS ABS: 2.1 10*3/uL (ref 0.7–4.0)
Lymphocytes Relative: 34.6 % (ref 12.0–46.0)
MCHC: 34.4 g/dL (ref 30.0–36.0)
MCV: 87.4 fl (ref 78.0–100.0)
MONO ABS: 0.6 10*3/uL (ref 0.1–1.0)
MONOS PCT: 9 % (ref 3.0–12.0)
NEUTROS ABS: 3.3 10*3/uL (ref 1.4–7.7)
Neutrophils Relative %: 54.3 % (ref 43.0–77.0)
Platelets: 323 10*3/uL (ref 150.0–400.0)
RBC: 4.25 Mil/uL (ref 3.87–5.11)
RDW: 12.9 % (ref 11.5–15.5)
WBC: 6.2 10*3/uL (ref 4.0–10.5)

## 2014-09-18 LAB — COMPREHENSIVE METABOLIC PANEL
ALK PHOS: 57 U/L (ref 39–117)
ALT: 28 U/L (ref 0–35)
AST: 20 U/L (ref 0–37)
Albumin: 4.2 g/dL (ref 3.5–5.2)
BUN: 10 mg/dL (ref 6–23)
CO2: 30 mEq/L (ref 19–32)
Calcium: 9.5 mg/dL (ref 8.4–10.5)
Chloride: 101 mEq/L (ref 96–112)
Creatinine, Ser: 0.78 mg/dL (ref 0.40–1.20)
GFR: 86.36 mL/min (ref >60.00)
Glucose, Bld: 109 mg/dL — ABNORMAL HIGH (ref 70–99)
Potassium: 4 mEq/L (ref 3.5–5.1)
Sodium: 136 mEq/L (ref 135–145)
TOTAL PROTEIN: 7.4 g/dL (ref 6.0–8.3)
Total Bilirubin: 0.4 mg/dL (ref 0.2–1.2)

## 2014-09-18 LAB — TSH: TSH: 0.52 u[IU]/mL (ref 0.35–4.50)

## 2014-09-18 LAB — VITAMIN B12: VITAMIN B 12: 213 pg/mL (ref 211–911)

## 2014-09-18 LAB — VITAMIN D 25 HYDROXY (VIT D DEFICIENCY, FRACTURES): VITD: 28.35 ng/mL — ABNORMAL LOW (ref 30.00–100.00)

## 2014-09-18 NOTE — Progress Notes (Signed)
Pre visit review using our clinic review tool, if applicable. No additional management support is needed unless otherwise documented below in the visit note. 

## 2014-09-18 NOTE — Progress Notes (Signed)
Subjective:    Patient ID: Adrienne Reese, female    DOB: May 28, 1971, 44 y.o.   MRN: [id number]  HPI Here with extreme/severe fatigue  Very sleepy all day long  She feels like she could nod off   She does snore at night (has run husband out of the room)  No witnessed apnea however  Wakes up very unrested   Generally does not sleep well anyway- but has never affected her like this  She has felt this way with vit D def in the past (continues to take vit D)   Is under a lot of stress   Also hormones- her cycle is changing and she gets heavy flow  Does not think she is anemic-donates blood   Mood is about the same - some days are better than others   Exercise -some (quite a bit of walking at work)- but not a lot of extra  Has to walk a bit campus      Patient Active Problem List   Diagnosis Date Noted  . UTI (urinary tract infection) 07/06/2014  . Melanoma of skin 03/12/2014  . Family history of breast cancer 03/12/2014  . Family history of colon cancer 03/12/2014  . Family history of Lynch syndrome 02/18/2014  . Low TSH level 10/09/2013  . Menorrhagia 10/03/2013  . Other malaise and fatigue 10/03/2013  . Adjustment disorder with mixed anxiety and depressed mood 10/03/2013  . Exercise intolerance 10/03/2013  . Seborrheic dermatitis 07/21/2013  . Hair loss 07/21/2013  . History of neck surgery 03/16/2013  . Papule 03/14/2013  . Unspecified vitamin D deficiency 02/17/2013  . Spinal stenosis in cervical region 02/17/2013  . DDD (degenerative disc disease), cervical 07/26/2012  . Family history of breast cancer in mother 07/24/2012   Past Medical History  Diagnosis Date  . Anxiety   . H/O bladder infections   . Back pain   . Hemorrhoids   . Arthritis   . History of depression   . History of kidney stones   . Anemia   . Cancer 2002    melanoma/basal cell  . Dyspareunia    Past Surgical History  Procedure Laterality Date  . Cesarean section  2005  .  Foot surgery  2001  . Cervical spine surgery  02/2013   History  Substance Use Topics  . Smoking status: Never Smoker   . Smokeless tobacco: Not on file  . Alcohol Use: 0.0 oz/week    0 Standard drinks or equivalent per week     Comment: drink 1-2 glasses of wine twice a year   Family History  Problem Relation Age of Onset  . Positive PPD/TB Exposure Neg Hx   . Cancer Mother     breast - left dx at 36, 2nd breast cancer diagnosis at 20 (? new primary); and melanoma on right arm at 59.  . Breast cancer Mother   . Hyperlipidemia Mother   . Depression Mother   . Fibroids Mother   . Hypertension Brother   . Hyperlipidemia Brother   . Cancer Maternal Grandmother     colon cancer at age 49; and melanoma on left arm at age ?; reportedly told she has Lynch syndrome (no report available today)  . Arthritis Maternal Grandmother   . Hyperlipidemia Maternal Grandmother   . Hypertension Maternal Grandmother   . Heart disease Maternal Grandmother   . Heart attack Maternal Grandmother   . Osteoporosis Maternal Grandmother   . Alcohol abuse Maternal Aunt   .  Alcohol abuse Maternal Grandfather   . Depression Father    Allergies  Allergen Reactions  . Clarithromycin Nausea And Vomiting  . Ranitidine Hcl Hives and Swelling  . Sulfa Antibiotics Hives  . Zithromax [Azithromycin]     GI   Current Outpatient Prescriptions on File Prior to Visit  Medication Sig Dispense Refill  . cetirizine (ZYRTEC) 10 MG tablet Take 10 mg by mouth daily.    . Cholecalciferol (VITAMIN D3) 2000 UNITS CHEW Chew 1 tablet by mouth daily.    Marland Kitchen escitalopram (LEXAPRO) 20 MG tablet Take 1 tablet (20 mg total) by mouth daily. 90 tablet 1   No current facility-administered medications on file prior to visit.    Review of Systems Review of Systems  Constitutional: Negative for fever, appetite change,  and unexpected weight change.  Eyes: Negative for pain and visual disturbance.  Respiratory: Negative for cough and  shortness of breath.   Cardiovascular: Negative for cp or palpitations    Gastrointestinal: Negative for nausea, diarrhea and constipation.  Genitourinary: Negative for urgency and frequency.  Skin: Negative for pallor or rash   Neurological: Negative for weakness, light-headedness, numbness and headaches.  Hematological: Negative for adenopathy. Does not bruise/bleed easily.  Psychiatric/Behavioral: pos for dep/anx symptoms that are improved but fluctuating         Objective:   Physical Exam  Constitutional: She appears well-developed and well-nourished. No distress.  HENT:  Head: Normocephalic and atraumatic.  Mouth/Throat: Oropharynx is clear and moist.  Eyes: Conjunctivae and EOM are normal. Pupils are equal, round, and reactive to light.  Neck: Normal range of motion. Neck supple. No JVD present. Carotid bruit is not present. No thyromegaly present.  Cardiovascular: Normal rate, regular rhythm, normal heart sounds and intact distal pulses.  Exam reveals no gallop.   Pulmonary/Chest: Effort normal and breath sounds normal. No respiratory distress. She has no wheezes. She has no rales.  No crackles  Abdominal: Soft. Bowel sounds are normal. She exhibits no distension, no abdominal bruit and no mass. There is no tenderness.  Musculoskeletal: She exhibits no edema.  Lymphadenopathy:    She has no cervical adenopathy.  Neurological: She is alert. She has normal reflexes.  Skin: Skin is warm and dry. No rash noted.  Psychiatric: Her speech is normal. Thought content normal. Her mood appears not anxious. Her affect is not blunt, not labile and not inappropriate. She is slowed. She exhibits a depressed mood.  Pt seems fatigued but pleasant and attentive          Assessment & Plan:   Problem List Items Addressed This Visit      Other   Fatigue - Primary    Suspect multifactorial Stressors (job and otherwise) Mood disorder-on lexapro  Also loud snoring at night - disc poss of  sleep apnea or other disordered sleeping Lab today  If all nl - will ref to sleep clinic for further eval        Relevant Orders   CBC with Differential/Platelet   Comprehensive metabolic panel   TSH   Vitamin B12   Snoring   Vitamin D deficiency    Pt is taking her vit D but feeling tired Vit D level today      Relevant Orders   Vit D  25 hydroxy (rtn osteoporosis monitoring)

## 2014-09-18 NOTE — Telephone Encounter (Signed)
Advised patient that as long as the medication is equivilant to 1000 mcg of b12 its fine to take.

## 2014-09-18 NOTE — Assessment & Plan Note (Signed)
Pt is taking her vit D but feeling tired Vit D level today

## 2014-09-18 NOTE — Telephone Encounter (Signed)
-----   Message from Abner Greenspan, MD sent at 09/18/2014  1:44 PM EDT ----- B12 level is in low normal range Please add B12 def to problem list and have her come in for a B12 shot when able  Ask her to take 1000 mcg of vit B12 daily otc as well  This may help fatigue D level is not up to par - would like it a bit higher  Double her D intake to 4000 iu daily

## 2014-09-18 NOTE — Assessment & Plan Note (Signed)
Suspect multifactorial Stressors (job and otherwise) Mood disorder-on lexapro  Also loud snoring at night - disc poss of sleep apnea or other disordered sleeping Lab today  If all nl - will ref to sleep clinic for further eval

## 2014-09-18 NOTE — Patient Instructions (Signed)
Labs today for fatigue  Labs today for fatigue and also vit D level  If all is normal we will consider a visit to the sleep clinic

## 2014-09-18 NOTE — Telephone Encounter (Signed)
I really don't know what that is opposed to a regular B12 otc - as long as the dose is equiv to 1000 mcg it is probably fine

## 2014-09-18 NOTE — Telephone Encounter (Signed)
Patient informed of lab results and recommendations.  Can patient take Superior methyl B12?

## 2014-09-24 ENCOUNTER — Ambulatory Visit (INDEPENDENT_AMBULATORY_CARE_PROVIDER_SITE_OTHER): Payer: BLUE CROSS/BLUE SHIELD | Admitting: *Deleted

## 2014-09-24 ENCOUNTER — Telehealth: Payer: Self-pay | Admitting: *Deleted

## 2014-09-24 DIAGNOSIS — E538 Deficiency of other specified B group vitamins: Secondary | ICD-10-CM

## 2014-09-24 MED ORDER — CYANOCOBALAMIN 1000 MCG/ML IJ SOLN
1000.0000 ug | Freq: Once | INTRAMUSCULAR | Status: AC
  Administered 2014-09-24: 1000 ug via INTRAMUSCULAR

## 2014-09-24 NOTE — Telephone Encounter (Signed)
Pt in for B12 injection today and was asking if she could take Melatonin with her Lexapro.

## 2014-09-25 NOTE — Telephone Encounter (Signed)
Pt notified of Dr. Marliss Coots comments/recommendations pt said she didn't want to increase her chance of feeling drowsy and sleepy so she said she wasn't going to try it

## 2014-09-25 NOTE — Telephone Encounter (Signed)
I looked into it - and since melatonin is over the counter, less info is available - but what I read indicated that there is a chance that it can enhance the effects (including side effects) of medicines of that class- so monitoring is recommended  If you feel weird or too sleepy/slowed down by it - stop it

## 2014-09-29 ENCOUNTER — Encounter: Payer: Self-pay | Admitting: Family Medicine

## 2014-10-05 ENCOUNTER — Encounter: Payer: Self-pay | Admitting: Family Medicine

## 2014-10-05 ENCOUNTER — Ambulatory Visit (INDEPENDENT_AMBULATORY_CARE_PROVIDER_SITE_OTHER): Payer: BLUE CROSS/BLUE SHIELD | Admitting: Family Medicine

## 2014-10-05 VITALS — BP 110/68 | HR 80 | Temp 98.2°F | Ht 67.5 in | Wt 174.5 lb

## 2014-10-05 DIAGNOSIS — E538 Deficiency of other specified B group vitamins: Secondary | ICD-10-CM

## 2014-10-05 DIAGNOSIS — R5382 Chronic fatigue, unspecified: Secondary | ICD-10-CM

## 2014-10-05 DIAGNOSIS — M2550 Pain in unspecified joint: Secondary | ICD-10-CM | POA: Diagnosis not present

## 2014-10-05 DIAGNOSIS — E559 Vitamin D deficiency, unspecified: Secondary | ICD-10-CM

## 2014-10-05 DIAGNOSIS — G4711 Idiopathic hypersomnia with long sleep time: Secondary | ICD-10-CM | POA: Insufficient documentation

## 2014-10-05 DIAGNOSIS — R0683 Snoring: Secondary | ICD-10-CM

## 2014-10-05 DIAGNOSIS — R400 Somnolence: Secondary | ICD-10-CM

## 2014-10-05 LAB — RHEUMATOID FACTOR: Rhuematoid fact SerPl-aCnc: <10 IU/mL (ref <=14)

## 2014-10-05 LAB — SEDIMENTATION RATE: Sed Rate: 17 mm/hr (ref 0–22)

## 2014-10-05 NOTE — Assessment & Plan Note (Signed)
Now on 4000 iu daily  Disc imp to bone and overall health

## 2014-10-05 NOTE — Assessment & Plan Note (Signed)
With fatigue but no rash or joint swelling RF and ANA and ESR today 

## 2014-10-05 NOTE — Progress Notes (Signed)
Subjective:    Patient ID: Adrienne Reese, female    DOB: [birth year], 44 y.o.   MRN: [id number]  HPI Had her B12 shot for level in 200s  Started 1000 mcg - SL daily   Also D 4000 iu vit D (level 28)   Still quite tired   Had disc poss of sleep apnea also with snoring   She had disc other issues with a friend - is worried about an auto immune problem  Suggested getting checked for lupus  Gets bouts of joint pain - randomly - for example R knee/ L elbow - on and off  Stiff fingers - occ when typing  No fam hx  Gets more redness in cheeks - ? If it is a rash  ? If worse with the sun   RA in family - MGM has that - she was tested a while ago however and it was neg   No specific swollen joints Does get swelling of fingers and feet- soft tissue  Has noticed hair loss over time - hair is generally less thick than it used to be   Patient Active Problem List   Diagnosis Date Noted  . Snoring 09/18/2014  . B12 deficiency 09/18/2014  . UTI (urinary tract infection) 07/06/2014  . Melanoma of skin 03/12/2014  . Family history of breast cancer 03/12/2014  . Family history of colon cancer 03/12/2014  . Family history of Lynch syndrome 02/18/2014  . Low TSH level 10/09/2013  . Menorrhagia 10/03/2013  . Fatigue 10/03/2013  . Adjustment disorder with mixed anxiety and depressed mood 10/03/2013  . Exercise intolerance 10/03/2013  . Seborrheic dermatitis 07/21/2013  . Hair loss 07/21/2013  . History of neck surgery 03/16/2013  . Papule 03/14/2013  . Vitamin D deficiency 02/17/2013  . Spinal stenosis in cervical region 02/17/2013  . DDD (degenerative disc disease), cervical 07/26/2012  . Family history of breast cancer in mother 07/24/2012   Past Medical History  Diagnosis Date  . Anxiety   . H/O bladder infections   . Back pain   . Hemorrhoids   . Arthritis   . History of depression   . History of kidney stones   . Anemia   . Cancer 2002    melanoma/basal cell  .  Dyspareunia    Past Surgical History  Procedure Laterality Date  . Cesarean section  2005  . Foot surgery  2001  . Cervical spine surgery  02/2013   History  Substance Use Topics  . Smoking status: Never Smoker   . Smokeless tobacco: Not on file  . Alcohol Use: 0.0 oz/week    0 Standard drinks or equivalent per week     Comment: drink 1-2 glasses of wine twice a year   Family History  Problem Relation Age of Onset  . Positive PPD/TB Exposure Neg Hx   . Cancer Mother     breast - left dx at 44, 2nd breast cancer diagnosis at 57 (? new primary); and melanoma on right arm at 30.  . Breast cancer Mother   . Hyperlipidemia Mother   . Depression Mother   . Fibroids Mother   . Hypertension Brother   . Hyperlipidemia Brother   . Cancer Maternal Grandmother     colon cancer at age 70; and melanoma on left arm at age ?; reportedly told she has Lynch syndrome (no report available today)  . Arthritis Maternal Grandmother   . Hyperlipidemia Maternal Grandmother   . Hypertension Maternal  Grandmother   . Heart disease Maternal Grandmother   . Heart attack Maternal Grandmother   . Osteoporosis Maternal Grandmother   . Alcohol abuse Maternal Aunt   . Alcohol abuse Maternal Grandfather   . Depression Father    Allergies  Allergen Reactions  . Clarithromycin Nausea And Vomiting  . Ranitidine Hcl Hives and Swelling  . Sulfa Antibiotics Hives  . Zithromax [Azithromycin]     GI   Current Outpatient Prescriptions on File Prior to Visit  Medication Sig Dispense Refill  . cetirizine (ZYRTEC) 10 MG tablet Take 10 mg by mouth daily.    . Cholecalciferol (VITAMIN D3) 2000 UNITS CHEW Chew 2 tablets by mouth daily.     . Cyanocobalamin (VITAMIN B-12 IJ) Inject 1,000 mcg as directed every 30 (thirty) days.    Marland Kitchen escitalopram (LEXAPRO) 20 MG tablet Take 1 tablet (20 mg total) by mouth daily. 90 tablet 1   No current facility-administered medications on file prior to visit.      Review of  Systems Review of Systems  Constitutional: Negative for fever, appetite change,  and unexpected weight change. pos for fatigue  Eyes: Negative for pain and visual disturbance.  ENT pos for loud snoring  Respiratory: Negative for cough and shortness of breath.   Cardiovascular: Negative for cp or palpitations    Gastrointestinal: Negative for nausea, diarrhea and constipation.  Genitourinary: Negative for urgency and frequency.  Skin: Negative for pallor or rash   MSK pos for jont pain and stiffness w/o swelling, neg for myofasical pain  Neurological: Negative for weakness, light-headedness, numbness and headaches.  Hematological: Negative for adenopathy. Does not bruise/bleed easily.  Psychiatric/Behavioral: Negative for dysphoric mood. The patient is not nervous/anxious.         Objective:   Physical Exam  Constitutional: She appears well-developed and well-nourished. No distress.  overwt and well app  HENT:  Head: Normocephalic and atraumatic.  Right Ear: External ear normal.  Left Ear: External ear normal.  Nose: Nose normal.  Mouth/Throat: Oropharynx is clear and moist.  Eyes: Conjunctivae and EOM are normal. Pupils are equal, round, and reactive to light. Right eye exhibits no discharge. Left eye exhibits no discharge. No scleral icterus.  Neck: Normal range of motion. Neck supple. No JVD present. Carotid bruit is not present. No thyromegaly present.  Cardiovascular: Normal rate, regular rhythm, normal heart sounds and intact distal pulses.  Exam reveals no gallop.   Pulmonary/Chest: Effort normal and breath sounds normal. No respiratory distress. She has no wheezes. She has no rales.  Abdominal: Soft. Bowel sounds are normal. She exhibits no distension and no mass. There is no tenderness.  Musculoskeletal: She exhibits tenderness. She exhibits no edema.  Some mild generalized tenderness in wrists/hands and knees   Lymphadenopathy:    She has no cervical adenopathy.    Neurological: She is alert. She has normal reflexes. No cranial nerve deficit. She exhibits normal muscle tone. Coordination normal.  Skin: Skin is warm and dry. No rash noted. No erythema. No pallor.  Psychiatric: She has a normal mood and affect.          Assessment & Plan:   Problem List Items Addressed This Visit      Digestive   B12 deficiency    Had a B12 shot and now on 1000 mcg daily Re check in 4-6 wk         Other   Fatigue    Suspect multifactorial  Taking B12 and D now for  low nl levels  Ref to sleep clinic  Lab for autoimmune jt dz       Pain, joint, multiple sites - Primary    With fatigue but no rash or joint swelling RF and ANA and ESR today      Relevant Orders   Rheumatoid factor   ANA   Sedimentation rate (Completed)   Snoring    With somnolence Ref to sleep clinic       Relevant Orders   Ambulatory referral to Pulmonology   Somnolence   Relevant Orders   Ambulatory referral to Pulmonology   Vitamin D deficiency    Now on 4000 iu daily  Disc imp to bone and overall health

## 2014-10-05 NOTE — Progress Notes (Signed)
Pre visit review using our clinic review tool, if applicable. No additional management support is needed unless otherwise documented below in the visit note. 

## 2014-10-05 NOTE — Assessment & Plan Note (Signed)
Had a B12 shot and now on 1000 mcg daily Re check in 4-6 wk

## 2014-10-05 NOTE — Assessment & Plan Note (Signed)
Suspect multifactorial  Taking B12 and D now for low nl levels  Ref to sleep clinic  Lab for autoimmune jt dz

## 2014-10-05 NOTE — Patient Instructions (Signed)
Continue the B12 and also the vit D  Stop at check out for referral to pulmonary sleep clinic  Lab today for auto immune joint disease  Lets check your B12 level in about 4-6 weeks

## 2014-10-05 NOTE — Assessment & Plan Note (Signed)
With somnolence Ref to sleep clinic

## 2014-10-06 LAB — ANA: ANA: NEGATIVE

## 2014-10-14 ENCOUNTER — Encounter: Payer: Self-pay | Admitting: Family Medicine

## 2014-11-09 ENCOUNTER — Other Ambulatory Visit (INDEPENDENT_AMBULATORY_CARE_PROVIDER_SITE_OTHER): Payer: BLUE CROSS/BLUE SHIELD

## 2014-11-09 DIAGNOSIS — E538 Deficiency of other specified B group vitamins: Secondary | ICD-10-CM

## 2014-11-09 LAB — VITAMIN B12: Vitamin B-12: 395 pg/mL (ref 211–911)

## 2014-11-12 ENCOUNTER — Ambulatory Visit: Payer: Self-pay

## 2014-11-19 ENCOUNTER — Telehealth: Payer: Self-pay

## 2014-11-19 ENCOUNTER — Ambulatory Visit (INDEPENDENT_AMBULATORY_CARE_PROVIDER_SITE_OTHER): Payer: BLUE CROSS/BLUE SHIELD

## 2014-11-19 DIAGNOSIS — E538 Deficiency of other specified B group vitamins: Secondary | ICD-10-CM

## 2014-11-19 MED ORDER — CYANOCOBALAMIN 1000 MCG/ML IJ SOLN
1000.0000 ug | Freq: Once | INTRAMUSCULAR | Status: AC
  Administered 2014-11-19: 1000 ug via INTRAMUSCULAR

## 2014-11-19 NOTE — Telephone Encounter (Signed)
Pt received 2nd B12 injection today and pt also takes oral B 12; pt cannot tell any difference in how she is feeling yet and pt wanted to know how long she may need to take monthly B 12 injections. Please advise.

## 2014-11-19 NOTE — Telephone Encounter (Signed)
Can do the monthly injections indefinitely or as long as she wants

## 2014-11-20 NOTE — Telephone Encounter (Signed)
Left voicemail letting pt know Dr. Tower's comments  

## 2014-12-09 ENCOUNTER — Ambulatory Visit (INDEPENDENT_AMBULATORY_CARE_PROVIDER_SITE_OTHER): Payer: BLUE CROSS/BLUE SHIELD | Admitting: Internal Medicine

## 2014-12-09 ENCOUNTER — Encounter: Payer: Self-pay | Admitting: Internal Medicine

## 2014-12-09 VITALS — BP 126/74 | HR 82 | Ht 68.0 in | Wt 178.0 lb

## 2014-12-09 DIAGNOSIS — R0683 Snoring: Secondary | ICD-10-CM | POA: Diagnosis not present

## 2014-12-09 DIAGNOSIS — G4733 Obstructive sleep apnea (adult) (pediatric): Secondary | ICD-10-CM | POA: Diagnosis not present

## 2014-12-09 NOTE — Progress Notes (Signed)
12/09/14- 44 yoF nonsmoker referred courtesy of [facility] for sleep medicine evaluation because of snoring and somnolence.  Medical problems include DDD Cspine Taking phentermine Complains of difficulty initiating and maintaining sleep, loud snoring, sleepiness while driving or at work. Frequently wakes at night. No sleep medicines. One cup of coffee in the morning. No ENT surgery. Denies my descriptions of cataplexy and sleep paralysis. Thyroid checked out normal.  Prior to Admission medications   Medication Sig Start Date End Date Taking? Authorizing Provider  cetirizine (ZYRTEC) 10 MG tablet Take 10 mg by mouth as needed.    Yes Historical Provider, MD  Cholecalciferol (HM VITAMIN D3) 4000 UNITS CAPS Take 1 capsule by mouth daily.   Yes Historical Provider, MD  Cyanocobalamin (VITAMIN B-12 IJ) Inject 1,000 mcg as directed every 30 (thirty) days.   Yes Historical Provider, MD  escitalopram (LEXAPRO) 20 MG tablet Take 1 tablet (20 mg total) by mouth daily. 08/13/14  Yes Abner Greenspan, MD  vitamin B-12 (CYANOCOBALAMIN) 1000 MCG tablet Take 1,000 mcg by mouth daily.   Yes Historical Provider, MD  phentermine (ADIPEX-P) 37.5 MG tablet Take 37.5 mg by mouth daily. 11/05/14   Historical Provider, MD   Past Medical History  Diagnosis Date  . Anxiety   . H/O bladder infections   . Back pain   . Hemorrhoids   . Arthritis   . History of depression   . History of kidney stones   . Anemia   . Cancer 2002    melanoma/basal cell  . Dyspareunia    Past Surgical History  Procedure Laterality Date  . Cesarean section  2005  . Foot surgery  2001  . Cervical spine surgery  02/2013   Family History  Problem Relation Age of Onset  . Positive PPD/TB Exposure Neg Hx   . Cancer Mother     breast - left dx at 52, 2nd breast cancer diagnosis at 62 (? new primary); and melanoma on right arm at 73.  . Breast cancer Mother   . Hyperlipidemia Mother   . Depression Mother   . Fibroids Mother   .  Hypertension Brother   . Hyperlipidemia Brother   . Cancer Maternal Grandmother     colon cancer at age 80; and melanoma on left arm at age ?; reportedly told she has Lynch syndrome (no report available today)  . Arthritis Maternal Grandmother   . Hyperlipidemia Maternal Grandmother   . Hypertension Maternal Grandmother   . Heart disease Maternal Grandmother   . Heart attack Maternal Grandmother   . Osteoporosis Maternal Grandmother   . Alcohol abuse Maternal Aunt   . Alcohol abuse Maternal Grandfather   . Depression Father    History   Social History  . Marital Status: Married    Spouse Name: N/A  . Number of Children: N/A  . Years of Education: N/A   Occupational History  . customer service    Social History Main Topics  . Smoking status: Never Smoker   . Smokeless tobacco: Not on file  . Alcohol Use: 0.0 oz/week    0 Standard drinks or equivalent per week     Comment: drink 1-2 glasses of wine twice a year  . Drug Use: No  . Sexual Activity: Yes    Birth Control/ Protection: Other-see comments     Comment: number of sex partners in the last 12 months 1  use [employer]   Other Topics Concern  . Not on file   Social  History Narrative   First day of last period - 06/18/2012   How many days do your periods last - 5   How often are your periods - once a month   How old were you when you started your periods - 44 years old, pain with periods   Number of pregnancies - 5   Number of children - 5   Number of miscarriages - 0   Number of abortions - 0               ROS-see HPI   Negative unless "+" Constitutional:    weight loss, night sweats, fevers, chills, fatigue, lassitude. HEENT:    +headaches, difficulty swallowing, tooth/dental problems, sore throat,       sneezing, itching, ear ache, nasal congestion, post nasal drip, snoring CV:    chest pain, orthopnea, PND, swelling in lower extremities, anasarca,                                  dizziness,  +palpitations Resp:   shortness of breath with exertion or at rest.                productive cough,   non-productive cough, coughing up of blood.              change in color of mucus.  wheezing.   Skin:    rash or lesions. GI:  No-   heartburn, +indigestion, abdominal pain, nausea, vomiting, diarrhea,                 change in bowel habits, loss of appetite GU: dysuria, change in color of urine, no urgency or frequency.   flank pain. MS:   joint pain, stiffness, decreased range of motion, back pain. Neuro-     nothing unusual Psych:  change in mood or affect.  +depression or anxiety.   memory loss.  OBJ- Physical Exam General- Alert, Oriented, Affect-appropriate, Distress- none acute Skin- rash-none, lesions- none, excoriation- none Lymphadenopathy- none Head- atraumatic            Eyes- Gross vision intact, PERRLA, conjunctivae and secretions clear            Ears- Hearing, canals-normal            Nose- Clear, no-Septal dev, mucus, polyps, erosion, perforation             Throat- Mallampati III , mucosa clear , drainage- none, tonsils- atrophic Neck- flexible , trachea midline, no stridor , thyroid nl, carotid no bruit Chest - symmetrical excursion , unlabored           Heart/CV- RRR , no murmur , no gallop  , no rub, nl s1 s2                           - JVD- none , edema- none, stasis changes- none, varices- none           Lung- clear to P&A, wheeze- none, cough- none , dullness-none, rub- none           Chest wall-  Abd-  Br/ Gen/ Rectal- Not done, not indicated Extrem- cyanosis- none, clubbing, none, atrophy- none, strength- nl Neuro- grossly intact to observation

## 2014-12-09 NOTE — Patient Instructions (Signed)
Order- schedule split protocol NPSG    Dx OSA

## 2014-12-13 NOTE — Assessment & Plan Note (Signed)
The questions whether her snoring, daytime sleepiness, difficulty sleeping at night and weight gain reflect obstructive sleep apnea. The major differential consideration would be depression. Plan-schedule sleep study as discussed with her

## 2014-12-17 ENCOUNTER — Ambulatory Visit (INDEPENDENT_AMBULATORY_CARE_PROVIDER_SITE_OTHER): Payer: BLUE CROSS/BLUE SHIELD | Admitting: Family Medicine

## 2014-12-17 ENCOUNTER — Encounter: Payer: Self-pay | Admitting: Family Medicine

## 2014-12-17 VITALS — BP 102/70 | HR 82 | Temp 97.8°F | Wt 176.2 lb

## 2014-12-17 DIAGNOSIS — R300 Dysuria: Secondary | ICD-10-CM | POA: Diagnosis not present

## 2014-12-17 DIAGNOSIS — N3000 Acute cystitis without hematuria: Secondary | ICD-10-CM

## 2014-12-17 LAB — POCT URINALYSIS DIPSTICK
BILIRUBIN UA: NEGATIVE
Blood, UA: NEGATIVE
GLUCOSE UA: NEGATIVE
Ketones, UA: NEGATIVE
Nitrite, UA: NEGATIVE
Spec Grav, UA: 1.015
UROBILINOGEN UA: NEGATIVE
pH, UA: 6

## 2014-12-17 MED ORDER — CIPROFLOXACIN HCL 250 MG PO TABS: 250.0000 mg | ORAL_TABLET | Freq: Two times a day (BID) | ORAL | Status: DC

## 2014-12-17 NOTE — Patient Instructions (Signed)
Drink plenty of water and start the antibiotics today.  We'll contact you with your lab report.  Take care.   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  Nothing to do unless it is painful or enlarging.

## 2014-12-17 NOTE — Progress Notes (Signed)
Pre visit review using our clinic review tool, if applicable. No additional management support is needed unless otherwise documented below in the visit note.  Dysuria: change in urination.  She can't keep a full bladder.  Urgency, frequency.  duration of symptoms: started today abdominal pain: no fevers:no back pain:no vomiting:no other concerns: h/o similar in the past, when she has a UTI.   Meds, vitals, and allergies reviewed.   ROS: See HPI.  Otherwise negative.    GEN: nad, alert and oriented HEENT: mucous membranes moist NECK: supple CV: rrr.  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

## 2014-12-18 NOTE — Assessment & Plan Note (Signed)
Likely, start cipro, check ucx.  See AVS.  Reassured re: lipoma.  No f/u needed on that.

## 2014-12-19 LAB — URINE CULTURE: Colony Count: 10000

## 2014-12-21 ENCOUNTER — Other Ambulatory Visit: Payer: Self-pay | Admitting: Family Medicine

## 2014-12-21 MED ORDER — CEPHALEXIN 500 MG PO CAPS: 500.0000 mg | ORAL_CAPSULE | Freq: Two times a day (BID) | ORAL | Status: DC

## 2014-12-24 ENCOUNTER — Telehealth: Payer: Self-pay | Admitting: Family Medicine

## 2014-12-24 ENCOUNTER — Encounter: Payer: Self-pay | Admitting: Family Medicine

## 2014-12-24 MED ORDER — ETONOGESTREL-ETHINYL ESTRADIOL 0.12-0.015 MG/24HR VA RING: VAGINAL_RING | VAGINAL | Status: DC

## 2014-12-24 NOTE — Telephone Encounter (Signed)
nuva ring to pt's pharmacy

## 2015-01-05 ENCOUNTER — Encounter: Payer: Self-pay | Admitting: Obstetrics & Gynecology

## 2015-01-19 ENCOUNTER — Ambulatory Visit (HOSPITAL_BASED_OUTPATIENT_CLINIC_OR_DEPARTMENT_OTHER): Payer: BLUE CROSS/BLUE SHIELD | Attending: Internal Medicine

## 2015-01-19 DIAGNOSIS — G4733 Obstructive sleep apnea (adult) (pediatric): Secondary | ICD-10-CM | POA: Diagnosis present

## 2015-01-19 DIAGNOSIS — G4700 Insomnia, unspecified: Secondary | ICD-10-CM | POA: Diagnosis not present

## 2015-01-19 DIAGNOSIS — R0683 Snoring: Secondary | ICD-10-CM | POA: Diagnosis not present

## 2015-01-23 DIAGNOSIS — G4733 Obstructive sleep apnea (adult) (pediatric): Secondary | ICD-10-CM | POA: Diagnosis not present

## 2015-01-23 NOTE — Progress Notes (Signed)
  Patient Name: Adrienne Reese, Adrienne Reese Date: 01/19/2015 Gender: Female D.O.B: 1971/02/18 Age (years): 32 Referring Provider: Baird Lyons MD, ABSM Height (inches): 68 Interpreting Physician: Baird Lyons MD, ABSM Weight (lbs): 175 RPSGT: Joni Reining BMI: 27 MRN: [id number] Neck Size: 14.50 CLINICAL INFORMATION Sleep Study Type: NPSG  Indication for sleep study: OSA  Epworth Sleepiness Score: 21  SLEEP STUDY TECHNIQUE As per the AASM Manual for the Scoring of Sleep and Associated Events v2.3 (April 2016) with a hypopnea requiring 4% desaturations.  The channels recorded and monitored were frontal, central and occipital EEG, electrooculogram (EOG), submentalis EMG (chin), nasal and oral airflow, thoracic and abdominal wall motion, anterior tibialis EMG, snore microphone, electrocardiogram, and pulse oximetry.  MEDICATIONS Patient's medications include: Charted for review. Medications self-administered by patient during sleep study : No sleep medicine administered.  SLEEP ARCHITECTURE The study was initiated at 10:08:13 PM and ended at 4:57:35 AM.  Sleep onset time was 43.0 minutes and the sleep efficiency was 82.8%. The total sleep time was 338.8 minutes.  Stage REM latency was 180.5 minutes.  The patient spent 7.23% of the night in stage N1 sleep, 80.96% in stage N2 sleep, 2.07% in stage N3 and 9.74% in REM.  Alpha intrusion was absent.  Supine sleep was 50.77%.  Awake after sleep onset 27.5 minutes  RESPIRATORY PARAMETERS The overall apnea/hypopnea index (AHI) was 0.0 per hour. There were 0 total apneas, including 0 obstructive, 0 central and 0 mixed apneas. There were 0 hypopneas and 0 RERAs.  The AHI during Stage REM sleep was 0.0 per hour.  AHI while supine was 0.0 per hour.  The mean oxygen saturation was 96.94%. The minimum SpO2 during sleep was 93.00%.  Moderate snoring was noted during this study.  CARDIAC DATA The 2 lead EKG demonstrated sinus  rhythm. The mean heart rate was 66.82 beats per minute. Other EKG findings include: None.  LEG MOVEMENT DATA The total PLMS were 74 with a resulting PLMS index of 13.10. Associated arousal with leg movement index was 0.7 .  IMPRESSIONS No significant obstructive sleep apnea occurred during this study (AHI = 0.0/h). No significant central sleep apnea occurred during this study (CAI = 0.0/h). The patient had minimal or no oxygen desaturation during the study (Min O2 = 93.00%) The patient snored with Moderate snoring volume. No cardiac abnormalities were noted during this study. Mild periodic limb movements of sleep occurred during the study. No significant associated arousals. The patient had some difficulty initiating sleep on this study night  DIAGNOSIS Primary Snoring (786.09 [R06.83 ICD-10]) Insomnia  RECOMMENDATIONS Further clinical assessment as appropriate Avoid alcohol, sedatives and other CNS depressants that may worsen sleep apnea and disrupt normal sleep architecture. Sleep hygiene should be reviewed to assess factors that may improve sleep quality. Weight management and regular exercise should be initiated or continued if appropriate.  Deneise Lever Diplomate, American Board of Sleep Medicine  ELECTRONICALLY SIGNED ON:  01/23/2015, 1:16 PM [place] PH: [phone]   FX: [phone] [place]

## 2015-02-04 ENCOUNTER — Ambulatory Visit (INDEPENDENT_AMBULATORY_CARE_PROVIDER_SITE_OTHER): Payer: BLUE CROSS/BLUE SHIELD | Admitting: Internal Medicine

## 2015-02-04 ENCOUNTER — Encounter: Payer: Self-pay | Admitting: Internal Medicine

## 2015-02-04 VITALS — BP 118/80 | HR 91 | Ht 68.0 in | Wt 172.0 lb

## 2015-02-04 DIAGNOSIS — R0683 Snoring: Secondary | ICD-10-CM

## 2015-02-04 DIAGNOSIS — G4711 Idiopathic hypersomnia with long sleep time: Secondary | ICD-10-CM

## 2015-02-04 MED ORDER — MODAFINIL 100 MG PO TABS: 100.0000 mg | ORAL_TABLET | Freq: Every day | ORAL | Status: DC

## 2015-02-04 NOTE — Patient Instructions (Signed)
It is still important to get enough sleep. Nap if you need to.  Samples Nuvigil 150 mg   Try 1/2 or 1 tab in the morning as needed  Script for generic Provigil/ modafanil to try if Nuvigil helps you.  For simple snoring try: Sleep off flat of back Treat stuffy nose/ allergy with otc Flonase nasal spray   1-2 puffs each nostril once daily at bedtime The drug stores often have mouth pieces that you heat to soften then bite into, to hold your mouth in position while you sleep

## 2015-02-04 NOTE — Progress Notes (Signed)
12/09/14- 65 yoF nonsmoker referred courtesy of [facility] for sleep medicine evaluation because of snoring and somnolence.  Medical problems include DDD Cspine Taking phentermine Complains of difficulty initiating and maintaining sleep, loud snoring, sleepiness while driving or at work. Frequently wakes at night. No sleep medicines. One cup of coffee in the morning. No ENT surgery. Denies my descriptions of cataplexy and sleep paralysis. Thyroid checked out normal.  02/04/15- 62 yoF nonsmoker referred courtesy of [facility] for sleep medicine evaluation because of snoring and somnolence. Follows For: Pt states she is here to review sleep study results. Pt has no new complaints at this time.  NPSG 01/19/15- AHI 0.0/ hr, moderate snoring, desat to 93%, some difficulty initiating and maintaining  Sleep, She feels she gets adequate sleep w/o med, admitting some restlessness at night and asking about effects of stress on sleep. Still oppressive daytime sleepiness- wants to nap, concerned about driving. No sleep paralysis or cataplexy. Discussed options and driving responsibility.  ROS-see HPI   Negative unless "+" Constitutional:    weight loss, night sweats, fevers, chills, fatigue, lassitude. HEENT:    +headaches, difficulty swallowing, tooth/dental problems, sore throat,       sneezing, itching, ear ache, nasal congestion, post nasal drip, snoring CV:    chest pain, orthopnea, PND, swelling in lower extremities, anasarca,                                                      dizziness, +palpitations Resp:   shortness of breath with exertion or at rest.                productive cough,   non-productive cough, coughing up of blood.              change in color of mucus.  wheezing.   Skin:    rash or lesions. GI:  No-   heartburn, +indigestion, abdominal pain, nausea, vomiting, diarrhea,                 change in bowel habits, loss of appetite GU: dysuria, change in color of urine, no urgency or  frequency.   flank pain. MS:   joint pain, stiffness, decreased range of motion, back pain. Neuro-     nothing unusual Psych:  change in mood or affect.  +depression or anxiety.   memory loss.  OBJ- Physical Exam General- Alert, Oriented, Affect-appropriate, Distress- none acute Skin- rash-none, lesions- none, excoriation- none Lymphadenopathy- none Head- atraumatic            Eyes- Gross vision intact, PERRLA, conjunctivae and secretions clear            Ears- Hearing, canals-normal            Nose- Clear, no-Septal dev, mucus, polyps, erosion, perforation             Throat- Mallampati III , mucosa clear , drainage- none, tonsils- atrophic Neck- flexible , trachea midline, no stridor , thyroid nl, carotid no bruit Chest - symmetrical excursion , unlabored           Heart/CV- RRR , no murmur , no gallop  , no rub, nl s1 s2                           -  JVD- none , edema- none, stasis changes- none, varices- none           Lung- clear to P&A, wheeze- none, cough- none , dullness-none, rub- none           Chest wall-  Abd-  Br/ Gen/ Rectal- Not done, not indicated Extrem- cyanosis- none, clubbing, none, atrophy- none, strength- nl Neuro- grossly intact to observation

## 2015-02-04 NOTE — Assessment & Plan Note (Signed)
Primary snoring. Plan- flonase, sleep off back, Breathe Right strips, mouth piece.

## 2015-02-04 NOTE — Assessment & Plan Note (Addendum)
Emphasized good sleep habits, naps, her responsibility to drive safely. Try sample Nuvigil 150 mg. Script for generic modafanil Discussed interaction with her hormone therapy If too expensive we will use ritalin/ adderall Also suggested she talk with Dr Glori Bickers- consider non-sedating antidepressant as alternative.

## 2015-02-09 ENCOUNTER — Ambulatory Visit (INDEPENDENT_AMBULATORY_CARE_PROVIDER_SITE_OTHER): Payer: BLUE CROSS/BLUE SHIELD | Admitting: Family Medicine

## 2015-02-09 ENCOUNTER — Telehealth: Payer: Self-pay | Admitting: Internal Medicine

## 2015-02-09 ENCOUNTER — Encounter: Payer: Self-pay | Admitting: Family Medicine

## 2015-02-09 VITALS — BP 126/78 | HR 68 | Temp 98.1°F | Ht 68.0 in | Wt 173.0 lb

## 2015-02-09 DIAGNOSIS — R3915 Urgency of urination: Secondary | ICD-10-CM | POA: Diagnosis not present

## 2015-02-09 DIAGNOSIS — N3000 Acute cystitis without hematuria: Secondary | ICD-10-CM | POA: Diagnosis not present

## 2015-02-09 LAB — POCT URINALYSIS DIPSTICK
Glucose, UA: NEGATIVE
Nitrite, UA: NEGATIVE
PH UA: 6
UROBILINOGEN UA: 0.2

## 2015-02-09 MED ORDER — METHYLPHENIDATE HCL 5 MG PO TABS: ORAL_TABLET | ORAL | Status: DC

## 2015-02-09 MED ORDER — CEPHALEXIN 500 MG PO CAPS: 500.0000 mg | ORAL_CAPSULE | Freq: Two times a day (BID) | ORAL | Status: DC

## 2015-02-09 NOTE — Assessment & Plan Note (Signed)
Last one was group B strep and keflex treated it  Will repeat keflex-send urine for cx Looks dehydrated-did enc fluids  Update if not starting to improve in a week or if worsening

## 2015-02-09 NOTE — Patient Instructions (Signed)
Drink lots of fluids  Take the keflex as directed  We will culture urine and contact you with results

## 2015-02-09 NOTE — Telephone Encounter (Signed)
Pt given samples of Nuvigil at last ov, was told to keep [name] updated with any side effects of the medication.  Pt states that she is increasingly agitated, makes her want to isolate herself.  Pt states she cannot tolerate medication well and would like an alternative medication.    [facility] uses [facility] on [address].    CY please advise on alternative med.  Thanks!

## 2015-02-09 NOTE — Progress Notes (Signed)
Pre visit review using our clinic review tool, if applicable. No additional management support is needed unless otherwise documented below in the visit note. 

## 2015-02-09 NOTE — Telephone Encounter (Signed)
Spoke with the pt and notified of recs per CDY  She verbalized understanding  She states that she prefers to come and pick up rx  It was signed and placed up front  Nothing further needed

## 2015-02-09 NOTE — Telephone Encounter (Signed)
Offer Rx ritalin 5 mg, # 30, 1-2 every 6 hours if needed, # 30, no refill. Explain it is controlled drug. Pick up Rx here or mail to confirmed address

## 2015-02-09 NOTE — Progress Notes (Signed)
Subjective:    Patient ID: Adrienne Reese, female    DOB: [birth year], 44 y.o.   MRN: [id number]  HPI Here for urinary symptoms   Frequent urination  Smell of urine is different  Cloudy urine  Uncomfortable urination with urgency  Less volume than expected   No n/v or fever   Results for orders placed or performed in visit on 02/09/15  POCT urinalysis dipstick  Result Value Ref Range   Color, UA Amber    Clarity, UA Cloudy    Glucose, UA Neg.    Bilirubin, UA Trace    Ketones, UA Small    Spec Grav, UA >=1.030    Blood, UA Moderate    pH, UA 6.0    Protein, UA 2+ (100 mg/dL)    Urobilinogen, UA 0.2    Nitrite, UA Neg.    Leukocytes, UA Trace (A) Negative    This weekend -not enough water /fluids   Patient Active Problem List   Diagnosis Date Noted  . Pain, joint, multiple sites 10/05/2014  . 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 Snoring 09/18/2014  . B12 deficiency 09/18/2014  . UTI (urinary tract infection) 07/06/2014  . Melanoma of skin 03/12/2014  . Family history of breast cancer 03/12/2014  . Family history of colon cancer 03/12/2014  . Family history of Lynch syndrome 02/18/2014  . Low TSH level 10/09/2013  . Menorrhagia 10/03/2013  . Fatigue 10/03/2013  . Adjustment disorder with mixed anxiety and depressed mood 10/03/2013  . Exercise intolerance 10/03/2013  . Seborrheic dermatitis 07/21/2013  . Hair loss 07/21/2013  . History of neck surgery 03/16/2013  . Papule 03/14/2013  . Vitamin D deficiency 02/17/2013  . Spinal stenosis in cervical region 02/17/2013  . DDD (degenerative disc disease), cervical 07/26/2012  . Family history of breast cancer in mother 07/24/2012   Past Medical History  Diagnosis Date  . Anxiety   . H/O bladder infections   . Back pain   . Hemorrhoids   . Arthritis   . History of depression   . History of kidney stones   . Anemia   . Cancer 2002    melanoma/basal cell  . Dyspareunia    Past Surgical History    Procedure Laterality Date  . Cesarean section  2005  . Foot surgery  2001  . Cervical spine surgery  02/2013   Social History  Substance Use Topics  . Smoking status: Never Smoker   . Smokeless tobacco: None  . Alcohol Use: 0.0 oz/week    0 Standard drinks or equivalent per week     Comment: drink 1-2 glasses of wine twice a year   Family History  Problem Relation Age of Onset  . Positive PPD/TB Exposure Neg Hx   . Cancer Mother     breast - left dx at 101, 2nd breast cancer diagnosis at 54 (? new primary); and melanoma on right arm at 45.  . Breast cancer Mother   . Hyperlipidemia Mother   . Depression Mother   . Fibroids Mother   . Hypertension Brother   . Hyperlipidemia Brother   . Cancer Maternal Grandmother     colon cancer at age 6; and melanoma on left arm at age ?; reportedly told she has Lynch syndrome (no report available today)  . Arthritis Maternal Grandmother   . Hyperlipidemia Maternal Grandmother   . Hypertension Maternal Grandmother   . Heart disease Maternal Grandmother   . Heart attack Maternal Grandmother   .  Osteoporosis Maternal Grandmother   . Alcohol abuse Maternal Aunt   . Alcohol abuse Maternal Grandfather   . Depression Father    Allergies  Allergen Reactions  . Ranitidine Hcl Hives and Swelling  . Clarithromycin Nausea And Vomiting  . Percocet [Oxycodone-Acetaminophen] Itching  . Sulfa Antibiotics Hives  . Vicodin [Hydrocodone-Acetaminophen] Nausea And Vomiting  . Zithromax [Azithromycin]     GI   Current Outpatient Prescriptions on File Prior to Visit  Medication Sig Dispense Refill  . cetirizine (ZYRTEC) 10 MG tablet Take 10 mg by mouth as needed.     . Cholecalciferol (HM VITAMIN D3) 4000 UNITS CAPS Take 1 capsule by mouth daily.    . Cyanocobalamin (VITAMIN B-12 IJ) Inject 1,000 mcg as directed every 30 (thirty) days.    Marland Kitchen escitalopram (LEXAPRO) 20 MG tablet Take 1 tablet (20 mg total) by mouth daily. 90 tablet 1  .  etonogestrel-ethinyl estradiol (NUVARING) 0.12-0.015 MG/24HR vaginal ring Insert vaginally and leave in place for 3 consecutive weeks, then remove for 1 week. 1 each 12  . vitamin B-12 (CYANOCOBALAMIN) 1000 MCG tablet Take 1,000 mcg by mouth daily.    . modafinil (PROVIGIL) 100 MG tablet Take 1 tablet (100 mg total) by mouth daily. (Patient not taking: Reported on 02/09/2015) 30 tablet 5   No current facility-administered medications on file prior to visit.      Review of Systems Review of Systems  Constitutional: Negative for fever, appetite change, fatigue and unexpected weight change.  Eyes: Negative for pain and visual disturbance.  Respiratory: Negative for cough and shortness of breath.   Cardiovascular: Negative for cp or palpitations    Gastrointestinal: Negative for nausea, diarrhea and constipation.  Genitourinary: pos for urgency and frequency. neg for flank pain and hematuria  Skin: Negative for pallor or rash   Neurological: Negative for weakness, light-headedness, numbness and headaches.  Hematological: Negative for adenopathy. Does not bruise/bleed easily.  Psychiatric/Behavioral: Negative for dysphoric mood. The patient is not nervous/anxious.         Objective:   Physical Exam  Constitutional: She appears well-developed and well-nourished. No distress.  HENT:  Head: Normocephalic and atraumatic.  Eyes: Conjunctivae and EOM are normal. Pupils are equal, round, and reactive to light.  Neck: Normal range of motion. Neck supple.  Cardiovascular: Normal rate, regular rhythm and normal heart sounds.   Pulmonary/Chest: Effort normal and breath sounds normal.  Abdominal: Soft. Bowel sounds are normal. She exhibits no distension. There is no tenderness. There is no rebound.  No cva tenderness  No suprapubic tenderness  Musculoskeletal: She exhibits no edema.  Lymphadenopathy:    She has no cervical adenopathy.  Neurological: She is alert.  Skin: No rash noted.    Psychiatric: She has a normal mood and affect.          Assessment & Plan:   Problem List Items Addressed This Visit      Genitourinary   UTI (urinary tract infection)    Last one was group B strep and keflex treated it  Will repeat keflex-send urine for cx Looks dehydrated-did enc fluids  Update if not starting to improve in a week or if worsening        Relevant Medications   cephALEXin (KEFLEX) 500 MG capsule   Other Relevant Orders   Urine culture    Other Visit Diagnoses    Urinary urgency    -  Primary    Relevant Orders    POCT urinalysis dipstick (Completed)

## 2015-02-10 ENCOUNTER — Other Ambulatory Visit: Payer: Self-pay | Admitting: *Deleted

## 2015-02-10 LAB — URINE CULTURE
COLONY COUNT: NO GROWTH
Organism ID, Bacteria: NO GROWTH

## 2015-02-10 MED ORDER — ETONOGESTREL-ETHINYL ESTRADIOL 0.12-0.015 MG/24HR VA RING: VAGINAL_RING | VAGINAL | Status: DC

## 2015-02-10 NOTE — Telephone Encounter (Signed)
Received fax requesting 90 day supply of Rx instead of 30 day supply, done

## 2015-02-15 ENCOUNTER — Telehealth: Payer: Self-pay | Admitting: Internal Medicine

## 2015-02-15 NOTE — Telephone Encounter (Signed)
Npted.

## 2015-02-15 NOTE — Telephone Encounter (Signed)
Called and spoke with pt Pt stated that she wanted Dr Annamaria Boots to know that new rx for ritalin 5mg  is working well Pt states that she is taking two tablets in the morning and two tablets in the afternoon  Message forwarded to Dr Annamaria Boots as a FYI  Allergies  Allergen Reactions  . Ranitidine Hcl Hives and Swelling  . Clarithromycin Nausea And Vomiting  . Percocet [Oxycodone-Acetaminophen] Itching  . Sulfa Antibiotics Hives  . Vicodin [Hydrocodone-Acetaminophen] Nausea And Vomiting  . Zithromax [Azithromycin]     GI    Current Outpatient Prescriptions on File Prior to Visit  Medication Sig Dispense Refill  . cephALEXin (KEFLEX) 500 MG capsule Take 1 capsule (500 mg total) by mouth 2 (two) times daily. 10 capsule 0  . cetirizine (ZYRTEC) 10 MG tablet Take 10 mg by mouth as needed.     . Cholecalciferol (HM VITAMIN D3) 4000 UNITS CAPS Take 1 capsule by mouth daily.    . Cyanocobalamin (VITAMIN B-12 IJ) Inject 1,000 mcg as directed every 30 (thirty) days.    Marland Kitchen escitalopram (LEXAPRO) 20 MG tablet Take 1 tablet (20 mg total) by mouth daily. 90 tablet 1  . etonogestrel-ethinyl estradiol (NUVARING) 0.12-0.015 MG/24HR vaginal ring Insert vaginally and leave in place for 3 consecutive weeks, then remove for 1 week. 3 each 3  . methylphenidate (RITALIN) 5 MG tablet 1-2 tablets every 6 hours as needed 30 tablet 0  . modafinil (PROVIGIL) 100 MG tablet Take 1 tablet (100 mg total) by mouth daily. (Patient not taking: Reported on 02/09/2015) 30 tablet 5  . vitamin B-12 (CYANOCOBALAMIN) 1000 MCG tablet Take 1,000 mcg by mouth daily.     No current facility-administered medications on file prior to visit.

## 2015-02-18 ENCOUNTER — Telehealth: Payer: Self-pay

## 2015-02-18 NOTE — Telephone Encounter (Signed)
Top dose of lexapro is 20 mg daily-it is not approved for more than that unfortunately

## 2015-02-18 NOTE — Telephone Encounter (Signed)
Pt left v/m; pt was supposed to be taking lexapro 20 mg daily; pt could not remember if she took the lexapro at night, so pt was taking lexapro in AM;  for about a week pt realized she was taking lexapro 20 mg twice a day. Pt said she actually feels a lot better taking the lexapro 20 mg bid. Pt wants to know if she can get new rx for lexapro 20 mg taking 1 tab bid. [facility]. 10/05/14 was last f/u appt.

## 2015-02-18 NOTE — Telephone Encounter (Signed)
Pt notified that max dose is 20mg  a day and she should not take it BID. Pt will call back to schedule a f/u to discuss other treatment options since the once a day isn't working well for her. Pt declined to schedule appt she said she will look at her schedule and call back but she will go back to taking Lexapro only once a day

## 2015-02-22 ENCOUNTER — Encounter: Payer: Self-pay | Admitting: Family Medicine

## 2015-02-22 ENCOUNTER — Telehealth: Payer: Self-pay | Admitting: Internal Medicine

## 2015-02-22 MED ORDER — METHYLPHENIDATE HCL 5 MG PO TABS: ORAL_TABLET | ORAL | Status: DC

## 2015-02-22 NOTE — Telephone Encounter (Signed)
Last OV 02/04/15 Pending OV 06/09/15 Last refill 02/09/15 >> Methylphenidate 5mg  1-2 every 6 hours as needed #30  CY - please advise on refill. Thanks.

## 2015-02-22 NOTE — Telephone Encounter (Signed)
Ok to refill, # 120, 1-2 every 6 hours if needed,   No refill

## 2015-02-22 NOTE — Telephone Encounter (Signed)
rx signed by CY.  Pt states she will pick this rx up in office.  Placed up front for pickup.  Nothing further needed.

## 2015-02-23 ENCOUNTER — Other Ambulatory Visit (INDEPENDENT_AMBULATORY_CARE_PROVIDER_SITE_OTHER): Payer: BLUE CROSS/BLUE SHIELD

## 2015-02-23 DIAGNOSIS — R8290 Unspecified abnormal findings in urine: Secondary | ICD-10-CM | POA: Diagnosis not present

## 2015-02-23 LAB — POCT URINALYSIS DIPSTICK
Glucose, UA: NEGATIVE
NITRITE UA: NEGATIVE
PH UA: 6
Protein, UA: NEGATIVE
Spec Grav, UA: 1.015
UROBILINOGEN UA: 0.2

## 2015-02-24 ENCOUNTER — Telehealth: Payer: Self-pay | Admitting: Family Medicine

## 2015-02-24 NOTE — Telephone Encounter (Signed)
Pt is requesting call back regarding labs.  Please call [phone] thanks

## 2015-02-24 NOTE — Telephone Encounter (Signed)
Pt notified results are on mychart and I advise her we are waiting on culture pt said her sxs are worsening so she was trying to check status of results. I advise her hopefully we will get them back tomorrow and we will call her as soon as we get them, pt verbalized understanding

## 2015-02-24 NOTE — Telephone Encounter (Signed)
I commented via mychart - ua has wbc in it so sent for culture  Will tx depending on cx results Enc good water intake

## 2015-02-25 ENCOUNTER — Telehealth: Payer: Self-pay | Admitting: Family Medicine

## 2015-02-25 LAB — URINE CULTURE: Colony Count: 70000

## 2015-02-25 MED ORDER — CIPROFLOXACIN HCL 250 MG PO TABS: 250.0000 mg | ORAL_TABLET | Freq: Two times a day (BID) | ORAL | Status: DC

## 2015-02-25 NOTE — Telephone Encounter (Signed)
Please call in cipro  uCX shows sens to it (was resistant to some other abx) Update if no improvement

## 2015-02-25 NOTE — Telephone Encounter (Signed)
Rx sent to pharmacy and pt notified of urine cx and Dr. Marliss Coots comments and instructions

## 2015-03-24 ENCOUNTER — Ambulatory Visit: Payer: Self-pay | Admitting: Internal Medicine

## 2015-04-05 ENCOUNTER — Ambulatory Visit (INDEPENDENT_AMBULATORY_CARE_PROVIDER_SITE_OTHER): Payer: BLUE CROSS/BLUE SHIELD | Admitting: Family Medicine

## 2015-04-05 ENCOUNTER — Encounter: Payer: Self-pay | Admitting: Family Medicine

## 2015-04-05 VITALS — BP 118/79 | HR 90 | Temp 98.8°F | Ht 68.0 in | Wt 176.8 lb

## 2015-04-05 DIAGNOSIS — R6889 Other general symptoms and signs: Secondary | ICD-10-CM | POA: Diagnosis not present

## 2015-04-05 MED ORDER — HYDROCODONE-HOMATROPINE 5-1.5 MG/5ML PO SYRP: ORAL_SOLUTION | ORAL | Status: DC

## 2015-04-05 NOTE — Progress Notes (Signed)
Pre visit review using our clinic review tool, if applicable. No additional management support is needed unless otherwise documented below in the visit note. 

## 2015-04-05 NOTE — Patient Instructions (Signed)
FLU-Like Illness Viral illness: High fever, headache, bad cough, body aches, sore throat, sometimes nausea Usually quick onset  TREATMENT 1. Decongestant: Sudafed (NOT IF HIGH BLOOD PRESSURE) 2. Nose Sprays: Saline nasal spray, Can use Afrin or Neosynephrine, but no more than 3 days   3. Cough Suppressants: DM portion of cough med, or Strong prescription 4. Expectorant: Liquify Secretions (Guaifenesin) 5. Example: Mucinex (expectorant) or Mucinex-D (expectorant and decongestant)   8. Rest helpful, but move some during day 9. Breathe moist air: Humidifier, Vaporizer, or steam from shower 10. No work or school until no fever for 24 hrs WITH NO Tylenol or Ibuprofen. 12. Wash hands and cover mouth with cough

## 2015-04-05 NOTE — Progress Notes (Signed)
Dr. Frederico Hamman T. Nassim Cosma, MD, [facility] [place], [address] Phone: [phone] Fax: [phone]  04/05/2015  Patient: Adrienne Reese, MRN: [id number], DOB: 1970-10-12, 44 y.o.  Primary Physician:  Loura Pardon, MD   Chief Complaint  Patient presents with  . Cough    Chest feels heavy  . Generalized Body Aches   Subjective:   Falen Lehrmann Ta presents with runny nose, sneezing, cough, sore throat, malaise, myalgias, arthralgia, chills, and fever.  Sick since Saturday. Does not really feel congested and has some pressure in her head. Also some in her ears. Coughing is really bad. Started with a sore throat.   + recent exposure to others with similar symptoms. ?  The patent denies sore throat as the primary complaint. Denies sthortness of breath/wheezing, otalgia, facial pain, abdominal pain, changes in bowel or bladder.  Generally feels terrible  PMH, PHS, Allergies, Problem List, Medications, Family History, and Social History have all been reviewed.  Patient Active Problem List   Diagnosis Date Noted  . Pain, joint, multiple sites 10/05/2014  . 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 Snoring 09/18/2014  . B12 deficiency 09/18/2014  . UTI (urinary tract infection) 07/06/2014  . Melanoma of skin ([place]) 03/12/2014  . Family history of breast cancer 03/12/2014  . Family history of colon cancer 03/12/2014  . Family history of Lynch syndrome 02/18/2014  . Low TSH level 10/09/2013  . Menorrhagia 10/03/2013  . Fatigue 10/03/2013  . Adjustment disorder with mixed anxiety and depressed mood 10/03/2013  . Exercise intolerance 10/03/2013  . Seborrheic dermatitis 07/21/2013  . Hair loss 07/21/2013  . History of neck surgery 03/16/2013  . Papule 03/14/2013  . Vitamin D deficiency 02/17/2013  . Spinal stenosis in cervical region 02/17/2013  . DDD (degenerative disc disease), cervical 07/26/2012  . Family  history of breast cancer in mother 07/24/2012    Past Medical History  Diagnosis Date  . Anxiety   . H/O bladder infections   . Back pain   . Hemorrhoids   . Arthritis   . History of depression   . History of kidney stones   . Anemia   . Cancer ([place]) 2002    melanoma/basal cell  . Dyspareunia     Past Surgical History  Procedure Laterality Date  . Cesarean section  2005  . Foot surgery  2001  . Cervical spine surgery  02/2013    Social History   Social History  . Marital Status: Married    Spouse Name: N/A  . Number of Children: N/A  . Years of Education: N/A   Occupational History  . customer service    Social History Main Topics  . Smoking status: Never Smoker   . Smokeless tobacco: Never Used  . Alcohol Use: 0.0 oz/week    0 Standard drinks or equivalent per week     Comment: drink 1-2 glasses of wine twice a year  . Drug Use: No  . Sexual Activity: Yes    Birth Control/ Protection: Other-see comments     Comment: number of sex partners in the last 12 months 1  use Essure   Other Topics Concern  . Not on file   Social History Narrative   First day of last period - 06/18/2012   How many days do your periods last - 5   How often are your periods - once a month   How old were you when you started  your periods - 44 years old, pain with periods   Number of pregnancies - 5   Number of children - 5   Number of miscarriages - 0   Number of abortions - 0                Family History  Problem Relation Age of Onset  . Positive PPD/TB Exposure Neg Hx   . Cancer Mother     breast - left dx at 31, 2nd breast cancer diagnosis at 13 (? new primary); and melanoma on right arm at 68.  . Breast cancer Mother   . Hyperlipidemia Mother   . Depression Mother   . Fibroids Mother   . Hypertension Brother   . Hyperlipidemia Brother   . Cancer Maternal Grandmother     colon cancer at age 59; and melanoma on left arm at age ?; reportedly told she has Lynch syndrome  (no report available today)  . Arthritis Maternal Grandmother   . Hyperlipidemia Maternal Grandmother   . Hypertension Maternal Grandmother   . Heart disease Maternal Grandmother   . Heart attack Maternal Grandmother   . Osteoporosis Maternal Grandmother   . Alcohol abuse Maternal Aunt   . Alcohol abuse Maternal Grandfather   . Depression Father     Allergies  Allergen Reactions  . Ranitidine Hcl Hives and Swelling  . Clarithromycin Nausea And Vomiting  . Percocet [Oxycodone-Acetaminophen] Itching  . Sulfa Antibiotics Hives  . Vicodin [Hydrocodone-Acetaminophen] Nausea And Vomiting  . Zithromax [Azithromycin]     GI    Medication list reviewed and updated in full in [place].  ROS as above, eating and drinking - tolerating PO. Urinating normally. No excessive vomitting or diarrhea. O/w as above.  Objective:   Blood pressure 118/79, pulse 90, temperature 98.8 F (37.1 C), temperature source Oral, height 5\' 8"  (1.727 m), weight 176 lb 12 oz (80.173 kg), last menstrual period 02/28/2015.  Gen: WDWN, NAD; A & O x3, cooperative. Pleasant.Globally Non-toxic HEENT: Normocephalic and atraumatic. Throat clear, w/o exudate, R TM clear, L TM - good landmarks, No fluid present. rhinnorhea. No frontal or maxillary sinus T. MMM NECK: Anterior cervical  LAD is absent CV: RRR, No M/G/R, cap refill <2 sec PULM: Breathing comfortably in no respiratory distress. no wheezing, crackles, rhonchi ABD: S,NT,ND,+BS. No HSM. No rebound. EXT: No c/c/e PSYCH: Friendly, good eye contact MSK: Nml gait  Assessment and Plan:   Flu-like symptoms   Supportive care, fluids, cough medicines as needed, and anti-pyretics. Infection control emphasized, including OOW or school until AF 24 hours.  Follow-up: No Follow-up on file.  New Prescriptions   HYDROCODONE-HOMATROPINE (HYCODAN) 5-1.5 MG/5ML SYRUP    1/2 - 1 tsp po at night before bed prn cough   Signed,  Almina Schul T. Dravon Nott,  MD   Patient's Medications  New Prescriptions   HYDROCODONE-HOMATROPINE (HYCODAN) 5-1.5 MG/5ML SYRUP    1/2 - 1 tsp po at night before bed prn cough  Previous Medications   CETIRIZINE (ZYRTEC) 10 MG TABLET    Take 10 mg by mouth as needed.    CHOLECALCIFEROL (HM VITAMIN D3) 4000 UNITS CAPS    Take 1 capsule by mouth daily.   ESCITALOPRAM (LEXAPRO) 20 MG TABLET    Take 1 tablet (20 mg total) by mouth daily.   ETONOGESTREL-ETHINYL ESTRADIOL (NUVARING) 0.12-0.015 MG/24HR VAGINAL RING    Insert vaginally and leave in place for 3 consecutive weeks, then remove for 1 week.   METHYLPHENIDATE (RITALIN) 5  MG TABLET    1-2 tablets every 6 hours as needed   VITAMIN B-12 (CYANOCOBALAMIN) 1000 MCG TABLET    Take 1,000 mcg by mouth daily.  Modified Medications   No medications on file  Discontinued Medications   CIPROFLOXACIN (CIPRO) 250 MG TABLET    Take 1 tablet (250 mg total) by mouth 2 (two) times daily.   CYANOCOBALAMIN (VITAMIN B-12 IJ)    Inject 1,000 mcg as directed every 30 (thirty) days.   MODAFINIL (PROVIGIL) 100 MG TABLET    Take 1 tablet (100 mg total) by mouth daily.

## 2015-04-13 ENCOUNTER — Telehealth: Payer: Self-pay | Admitting: Internal Medicine

## 2015-04-13 MED ORDER — METHYLPHENIDATE HCL 5 MG PO TABS: ORAL_TABLET | ORAL | Status: DC

## 2015-04-13 NOTE — Telephone Encounter (Signed)
Ok to refill 

## 2015-04-13 NOTE — Telephone Encounter (Signed)
Pt aware that Rx placed in mail. Nothing further needed.

## 2015-04-13 NOTE — Telephone Encounter (Signed)
rx printed, signed, and placed in outgoing mail.   lmtcb X1 to make pt aware.

## 2015-04-13 NOTE — Telephone Encounter (Signed)
Called and spoke with pt Pt is requesting a refill on Ritalin 5mg  tablet Last filled 02/12/2015 #120 with no additional refills Last OV 02/04/15 F/U appt 06/09/15  Pt requested that rx be mailed to her at : [address] [place], [place] [address]  Dr Annamaria Boots, please advise if you are ok with refilling this medication. Thanks  Allergies  Allergen Reactions  . Ranitidine Hcl Hives and Swelling  . Clarithromycin Nausea And Vomiting  . Percocet [Oxycodone-Acetaminophen] Itching  . Sulfa Antibiotics Hives  . Vicodin [Hydrocodone-Acetaminophen] Nausea And Vomiting  . Zithromax [Azithromycin]     GI   Current Outpatient Prescriptions on File Prior to Visit  Medication Sig Dispense Refill  . cetirizine (ZYRTEC) 10 MG tablet Take 10 mg by mouth as needed.     . Cholecalciferol (HM VITAMIN D3) 4000 UNITS CAPS Take 1 capsule by mouth daily.    Marland Kitchen escitalopram (LEXAPRO) 20 MG tablet Take 1 tablet (20 mg total) by mouth daily. 90 tablet 1  . etonogestrel-ethinyl estradiol (NUVARING) 0.12-0.015 MG/24HR vaginal ring Insert vaginally and leave in place for 3 consecutive weeks, then remove for 1 week. 3 each 3  . HYDROcodone-homatropine (HYCODAN) 5-1.5 MG/5ML syrup 1/2 - 1 tsp po at night before bed prn cough 120 mL 0  . methylphenidate (RITALIN) 5 MG tablet 1-2 tablets every 6 hours as needed 120 tablet 0  . vitamin B-12 (CYANOCOBALAMIN) 1000 MCG tablet Take 1,000 mcg by mouth daily.     No current facility-administered medications on file prior to visit.

## 2015-04-19 ENCOUNTER — Telehealth: Payer: Self-pay | Admitting: Internal Medicine

## 2015-04-19 MED ORDER — METHYLPHENIDATE HCL 5 MG PO TABS: ORAL_TABLET | ORAL | Status: DC

## 2015-04-19 NOTE — Telephone Encounter (Signed)
Called and spoke to pt. Pt stated her Ritalin rx was mailed to the incorrect address. Per phone note from 04/13/2015 the rx was mailed to [facility]. Pt's correct address is [address]. (the correct address has been verified and updated in pt's chart)  Dr. Annamaria Boots please advise if ok to write another Ritalin rx. Thanks.   Allergies  Allergen Reactions  . Ranitidine Hcl Hives and Swelling  . Clarithromycin Nausea And Vomiting  . Percocet [Oxycodone-Acetaminophen] Itching  . Sulfa Antibiotics Hives  . Vicodin [Hydrocodone-Acetaminophen] Nausea And Vomiting  . Zithromax [Azithromycin]     GI    Current Outpatient Prescriptions on File Prior to Visit  Medication Sig Dispense Refill  . cetirizine (ZYRTEC) 10 MG tablet Take 10 mg by mouth as needed.     . Cholecalciferol (HM VITAMIN D3) 4000 UNITS CAPS Take 1 capsule by mouth daily.    Marland Kitchen escitalopram (LEXAPRO) 20 MG tablet Take 1 tablet (20 mg total) by mouth daily. 90 tablet 1  . etonogestrel-ethinyl estradiol (NUVARING) 0.12-0.015 MG/24HR vaginal ring Insert vaginally and leave in place for 3 consecutive weeks, then remove for 1 week. 3 each 3  . HYDROcodone-homatropine (HYCODAN) 5-1.5 MG/5ML syrup 1/2 - 1 tsp po at night before bed prn cough 120 mL 0  . methylphenidate (RITALIN) 5 MG tablet 1-2 tablets every 6 hours as needed 120 tablet 0  . vitamin B-12 (CYANOCOBALAMIN) 1000 MCG tablet Take 1,000 mcg by mouth daily.     No current facility-administered medications on file prior to visit.

## 2015-04-19 NOTE — Telephone Encounter (Signed)
Spoke with pt. Verified her address one more time. Advised her that we will place this in the mail to her today. Nothing further was needed.

## 2015-04-19 NOTE — Telephone Encounter (Signed)
Ok o re-write

## 2015-04-20 LAB — HM PAP SMEAR: HM Pap smear: NORMAL

## 2015-04-23 ENCOUNTER — Telehealth: Payer: Self-pay | Admitting: Internal Medicine

## 2015-04-23 ENCOUNTER — Other Ambulatory Visit: Payer: Self-pay | Admitting: *Deleted

## 2015-04-23 MED ORDER — METHYLPHENIDATE HCL 5 MG PO TABS: ORAL_TABLET | ORAL | Status: DC

## 2015-04-23 NOTE — Telephone Encounter (Signed)
Ok to print ritalin script.

## 2015-04-23 NOTE — Telephone Encounter (Signed)
Rx printed and placed on Dr. Janee Morn cart for signature.  To Katie for follow up

## 2015-04-23 NOTE — Telephone Encounter (Signed)
Called spoke with pt. She called on 11/8 to have her ritalin RX mailed to her but was mailed to wrong address. Pt called on 11/14 to have this re mailed to her. Pt reports she still has not received the RX yet and is now wanting to come in and pick up RX for the ritalin. Please advise Dr. Annamaria Boots thanks

## 2015-04-23 NOTE — Telephone Encounter (Signed)
Rx signed and placed at front to be picked up. Attempted to contact patient, left detailed message on voicemail advising patient that Rx is at front desk to be picked up. Nothing further needed. Closing encounter

## 2015-04-23 NOTE — Telephone Encounter (Signed)
Randa Spike, CMA at 04/19/2015 11:01 AM     Status: Signed       Expand All Collapse All   Spoke with pt. Verified her address one more time. Advised her that we will place this in the mail to her today. Nothing further was needed.

## 2015-04-28 ENCOUNTER — Encounter: Payer: Self-pay | Admitting: Obstetrics and Gynecology

## 2015-04-28 ENCOUNTER — Ambulatory Visit (INDEPENDENT_AMBULATORY_CARE_PROVIDER_SITE_OTHER): Payer: BLUE CROSS/BLUE SHIELD | Admitting: Obstetrics and Gynecology

## 2015-04-28 VITALS — BP 124/62 | HR 88 | Resp 16 | Wt 178.0 lb

## 2015-04-28 DIAGNOSIS — N760 Acute vaginitis: Secondary | ICD-10-CM | POA: Diagnosis not present

## 2015-04-28 DIAGNOSIS — B373 Candidiasis of vulva and vagina: Secondary | ICD-10-CM

## 2015-04-28 DIAGNOSIS — Z113 Encounter for screening for infections with a predominantly sexual mode of transmission: Secondary | ICD-10-CM

## 2015-04-28 DIAGNOSIS — B3731 Acute candidiasis of vulva and vagina: Secondary | ICD-10-CM

## 2015-04-28 DIAGNOSIS — A499 Bacterial infection, unspecified: Secondary | ICD-10-CM | POA: Diagnosis not present

## 2015-04-28 DIAGNOSIS — B9689 Other specified bacterial agents as the cause of diseases classified elsewhere: Secondary | ICD-10-CM

## 2015-04-28 MED ORDER — METRONIDAZOLE 500 MG PO TABS: 500.0000 mg | ORAL_TABLET | Freq: Two times a day (BID) | ORAL | Status: DC

## 2015-04-28 MED ORDER — FLUCONAZOLE 150 MG PO TABS: ORAL_TABLET | ORAL | Status: DC

## 2015-04-28 NOTE — Progress Notes (Signed)
Patient ID: Adrienne Reese, female   DOB: 12/25/1970, 44 y.o.   MRN: [id number] GYNECOLOGY  VISIT   HPI: 44 y.o.  Divorced Caucasian  female   [id number] with Patient's last menstrual period was 03/25/2015.    here c/o vaginal discharge and odor x 1 day. The discharge is milky, smells like sour mild. No pruritus, burning or irritation. Although she does report that her vulva is easily irritated. She uses only dove soap. Sexually active, same partner x 5-6 months. H/O essure in 2012, on nuvaring for cycle control.  GYNECOLOGIC HISTORY: Patient's last menstrual period was 03/25/2015. Contraception:Nuvaring Menopausal hormone therapy: N/A        OB History    Gravida Para Term Preterm AB TAB SAB Ectopic Multiple Living   5 5 5       5          Patient Active Problem List   Diagnosis Date Noted  . Pain, joint, multiple sites 10/05/2014  . 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 Snoring 09/18/2014  . B12 deficiency 09/18/2014  . UTI (urinary tract infection) 07/06/2014  . Melanoma of skin ([place]) 03/12/2014  . Family history of breast cancer 03/12/2014  . Family history of colon cancer 03/12/2014  . Family history of Lynch syndrome 02/18/2014  . Low TSH level 10/09/2013  . Menorrhagia 10/03/2013  . Fatigue 10/03/2013  . Adjustment disorder with mixed anxiety and depressed mood 10/03/2013  . Exercise intolerance 10/03/2013  . Seborrheic dermatitis 07/21/2013  . Hair loss 07/21/2013  . History of neck surgery 03/16/2013  . Papule 03/14/2013  . Vitamin D deficiency 02/17/2013  . Spinal stenosis in cervical region 02/17/2013  . DDD (degenerative disc disease), cervical 07/26/2012  . Family history of breast cancer in mother 07/24/2012    Past Medical History  Diagnosis Date  . Anxiety   . H/O bladder infections   . Back pain   . Hemorrhoids   . Arthritis   . History of depression   . History of kidney stones   . Anemia   . Cancer ([place]) 2002    melanoma/basal cell  .  Dyspareunia     Past Surgical History  Procedure Laterality Date  . Cesarean section  2005  . Foot surgery  2001  . Cervical spine surgery  02/2013    Current Outpatient Prescriptions  Medication Sig Dispense Refill  . cetirizine (ZYRTEC) 10 MG tablet Take 10 mg by mouth as needed.     . Cholecalciferol (HM VITAMIN D3) 4000 UNITS CAPS Take 1 capsule by mouth daily.    Marland Kitchen escitalopram (LEXAPRO) 20 MG tablet Take 1 tablet (20 mg total) by mouth daily. 90 tablet 1  . etonogestrel-ethinyl estradiol (NUVARING) 0.12-0.015 MG/24HR vaginal ring Insert vaginally and leave in place for 3 consecutive weeks, then remove for 1 week. 3 each 3  . vitamin B-12 (CYANOCOBALAMIN) 1000 MCG tablet Take 1,000 mcg by mouth daily.    . fluconazole (DIFLUCAN) 150 MG tablet Take one tablet.  Repeat in 72 hours if symptoms are not completely resolved. 2 tablet 0  . metroNIDAZOLE (FLAGYL) 500 MG tablet Take 1 tablet (500 mg total) by mouth 2 (two) times daily. 14 tablet 0   No current facility-administered medications for this visit.     ALLERGIES: Ranitidine hcl; Clarithromycin; Percocet; Sulfa antibiotics; Vicodin; and Zithromax  Family History  Problem Relation Age of Onset  . Positive PPD/TB Exposure Neg Hx   . Cancer Mother     breast - left dx  at 74, 2nd breast cancer diagnosis at 24 (? new primary); and melanoma on right arm at 24.  . Breast cancer Mother   . Hyperlipidemia Mother   . Depression Mother   . Fibroids Mother   . Hypertension Brother   . Hyperlipidemia Brother   . Cancer Maternal Grandmother     colon cancer at age 53; and melanoma on left arm at age ?; reportedly told she has Lynch syndrome (no report available today)  . Arthritis Maternal Grandmother   . Hyperlipidemia Maternal Grandmother   . Hypertension Maternal Grandmother   . Heart disease Maternal Grandmother   . Heart attack Maternal Grandmother   . Osteoporosis Maternal Grandmother   . Alcohol abuse Maternal Aunt   .  Alcohol abuse Maternal Grandfather   . Depression Father     Social History   Social History  . Marital Status: Divorced    Spouse Name: N/A  . Number of Children: N/A  . Years of Education: N/A   Occupational History  . customer service    Social History Main Topics  . Smoking status: Never Smoker   . Smokeless tobacco: Never Used  . Alcohol Use: 0.0 oz/week    0 Standard drinks or equivalent per week     Comment: drink 1-2 glasses of wine twice a year  . Drug Use: No  . Sexual Activity: Yes    Birth Control/ Protection: Other-see comments     Comment: number of sex partners in the last 12 months 1  use Essure   Other Topics Concern  . Not on file   Social History Narrative   First day of last period - 06/18/2012   How many days do your periods last - 5   How often are your periods - once a month   How old were you when you started your periods - 44 years old, pain with periods   Number of pregnancies - 5   Number of children - 5   Number of miscarriages - 0   Number of abortions - 0                Review of Systems  Genitourinary:       Vaginal discharge Vaginal odor   All other systems reviewed and are negative.   PHYSICAL EXAMINATION:    BP 124/62 mmHg  Pulse 88  Resp 16  Wt 178 lb (80.74 kg)  LMP 03/25/2015    General appearance: alert, cooperative and appears stated age  Pelvic: External genitalia:  no lesions              Urethra:  normal appearing urethra with no masses, tenderness or lesions              Bartholins and Skenes: normal                 Vagina: normal appearing vagina with an increase in watery, white vaginal d/c              Cervix: no lesions               Chaperone was present for exam.  Wet prep: ++ clue, no  trich, rare wbc KOH: few yeast PH: 5   ASSESSMENT Bacterial vaginitis Yeast Std screening    PLAN Treat with flagyl (no ETOH) and diflucan STD testing F/U for an annual exam   An After Visit Summary was  printed and given to the patient.

## 2015-04-28 NOTE — Patient Instructions (Signed)

## 2015-04-29 LAB — HEPATITIS C ANTIBODY: HCV Ab: NEGATIVE

## 2015-04-29 LAB — STD PANEL
HEP B S AG: NEGATIVE
HIV 1&2 Ab, 4th Generation: NONREACTIVE

## 2015-04-29 LAB — GC/CHLAMYDIA PROBE AMP
CT PROBE, AMP APTIMA: NEGATIVE
GC PROBE AMP APTIMA: NEGATIVE

## 2015-05-11 ENCOUNTER — Telehealth: Payer: Self-pay | Admitting: *Deleted

## 2015-05-11 NOTE — Telephone Encounter (Signed)
Call to patient, left message to call back. Left message stating need to move appointment down later in the morning tomorrow due to surgery schedule.

## 2015-05-12 ENCOUNTER — Ambulatory Visit: Payer: BLUE CROSS/BLUE SHIELD | Admitting: Obstetrics and Gynecology

## 2015-05-12 NOTE — Telephone Encounter (Signed)
Left patient another message to confirm if patient received the message about rescheduling her appointment today. I left details with 2:30 pm and 3:45pm  available today and to please call back today.

## 2015-05-12 NOTE — Telephone Encounter (Signed)
Patient did get the message to reschedule her  aex appointment today due to surgery delay.. Patient is not available this afternoon and rescheduled to 05/19/15 @ 10:30am.nr

## 2015-05-16 ENCOUNTER — Other Ambulatory Visit: Payer: Self-pay | Admitting: Family Medicine

## 2015-05-19 ENCOUNTER — Encounter: Payer: Self-pay | Admitting: Obstetrics and Gynecology

## 2015-05-19 ENCOUNTER — Ambulatory Visit: Payer: BLUE CROSS/BLUE SHIELD | Admitting: Obstetrics and Gynecology

## 2015-05-20 ENCOUNTER — Ambulatory Visit (INDEPENDENT_AMBULATORY_CARE_PROVIDER_SITE_OTHER): Payer: BLUE CROSS/BLUE SHIELD | Admitting: Family Medicine

## 2015-05-20 ENCOUNTER — Encounter: Payer: Self-pay | Admitting: Family Medicine

## 2015-05-20 VITALS — BP 100/62 | HR 90 | Temp 98.0°F | Ht 68.0 in | Wt 176.8 lb

## 2015-05-20 DIAGNOSIS — N3001 Acute cystitis with hematuria: Secondary | ICD-10-CM

## 2015-05-20 DIAGNOSIS — R350 Frequency of micturition: Secondary | ICD-10-CM | POA: Diagnosis not present

## 2015-05-20 LAB — POCT URINALYSIS DIP (MANUAL ENTRY)
Bilirubin, UA: NEGATIVE
Glucose, UA: NEGATIVE
Ketones, POC UA: NEGATIVE
Nitrite, UA: NEGATIVE
PH UA: 6
SPEC GRAV UA: 1.025
UROBILINOGEN UA: 0.2

## 2015-05-20 MED ORDER — CIPROFLOXACIN HCL 250 MG PO TABS: 250.0000 mg | ORAL_TABLET | Freq: Two times a day (BID) | ORAL | Status: DC

## 2015-05-20 NOTE — Addendum Note (Signed)
Addended by: Carter Kitten on: 05/20/2015 10:22 AM   Modules accepted: Orders

## 2015-05-20 NOTE — Progress Notes (Signed)
Pre visit review using our clinic review tool, if applicable. No additional management support is needed unless otherwise documented below in the visit note. 

## 2015-05-20 NOTE — Progress Notes (Signed)
   Subjective:    Patient ID: Adrienne Reese, female    DOB: [birth year], 44 y.o.   MRN: [id number]  Urinary Frequency  This is a new problem. The current episode started 1 to 4 weeks ago (seen at urgent care 1.5 week ago, treated with amcrobid for UTI, symptoms returned halfway through course, could not find culture at urgent care.). The problem occurs every urination. The problem has been gradually worsening. The quality of the pain is described as burning. The pain is moderate. There has been no fever. Associated symptoms include frequency, hematuria and urgency. Pertinent negatives include no chills, discharge, flank pain, nausea or vomiting. She has tried increased fluids (azo, macrobid) for the symptoms. The treatment provided no relief. Her past medical history is significant for recurrent UTIs.  Dysuria  Associated symptoms include frequency, hematuria and urgency. Pertinent negatives include no chills, discharge, flank pain, nausea or vomiting. Her past medical history is significant for recurrent UTIs.     UA today positive for blood and leuks, lots of protein.  Last UTI Ecoli  Resistant to sulfa and amp 02/2015. Social History /Family History/Past Medical History reviewed and updated if needed.   Review of Systems  Constitutional: Negative for chills.  Gastrointestinal: Negative for nausea and vomiting.  Genitourinary: Positive for dysuria, urgency, frequency and hematuria. Negative for flank pain.       Objective:   Physical Exam  Constitutional: Vital signs are normal. She appears well-developed and well-nourished. She is cooperative.  Non-toxic appearance. She does not appear ill. No distress.  HENT:  Head: Normocephalic.  Right Ear: Hearing, tympanic membrane, external ear and ear canal normal. Tympanic membrane is not erythematous, not retracted and not bulging.  Left Ear: Hearing, tympanic membrane, external ear and ear canal normal. Tympanic membrane is not  erythematous, not retracted and not bulging.  Nose: No mucosal edema or rhinorrhea. Right sinus exhibits no maxillary sinus tenderness and no frontal sinus tenderness. Left sinus exhibits no maxillary sinus tenderness and no frontal sinus tenderness.  Mouth/Throat: Uvula is midline, oropharynx is clear and moist and mucous membranes are normal.  Eyes: Conjunctivae, EOM and lids are normal. Pupils are equal, round, and reactive to light. Lids are everted and swept, no foreign bodies found.  Neck: Trachea normal and normal range of motion. Neck supple. Carotid bruit is not present. No thyroid mass and no thyromegaly present.  Cardiovascular: Normal rate, regular rhythm, S1 normal, S2 normal, normal heart sounds, intact distal pulses and normal pulses.  Exam reveals no gallop and no friction rub.   No murmur heard. Pulmonary/Chest: Effort normal and breath sounds normal. No tachypnea. No respiratory distress. She has no decreased breath sounds. She has no wheezes. She has no rhonchi. She has no rales.  Abdominal: Soft. Normal appearance and bowel sounds are normal. There is tenderness in the suprapubic area. There is no CVA tenderness.  Neurological: She is alert.  Skin: Skin is warm, dry and intact. No rash noted.  Psychiatric: Her speech is normal and behavior is normal. Judgment and thought content normal. Her mood appears not anxious. Cognition and memory are normal. She does not exhibit a depressed mood.          Assessment & Plan:

## 2015-05-20 NOTE — Assessment & Plan Note (Signed)
Recurrent, in past resistent bacteria on culture. Cipro was effective. Send for culture. Treat with 7 day course of cipro. Push fluids.

## 2015-05-20 NOTE — Patient Instructions (Signed)
Complete antibiotics.  Push fluids.  We will call with culture results.  Urinary Tract Infection Urinary tract infections (UTIs) can develop anywhere along your urinary tract. Your urinary tract is your body's drainage system for removing wastes and extra water. Your urinary tract includes two kidneys, two ureters, a bladder, and a urethra. Your kidneys are a pair of bean-shaped organs. Each kidney is about the size of your fist. They are located below your ribs, one on each side of your spine. CAUSES Infections are caused by microbes, which are microscopic organisms, including fungi, viruses, and bacteria. These organisms are so small that they can only be seen through a microscope. Bacteria are the microbes that most commonly cause UTIs. SYMPTOMS  Symptoms of UTIs may vary by age and gender of the patient and by the location of the infection. Symptoms in young women typically include a frequent and intense urge to urinate and a painful, burning feeling in the bladder or urethra during urination. Older women and men are more likely to be tired, shaky, and weak and have muscle aches and abdominal pain. A fever may mean the infection is in your kidneys. Other symptoms of a kidney infection include pain in your back or sides below the ribs, nausea, and vomiting. DIAGNOSIS To diagnose a UTI, your caregiver will ask you about your symptoms. Your caregiver will also ask you to provide a urine sample. The urine sample will be tested for bacteria and white blood cells. White blood cells are made by your body to help fight infection. TREATMENT  Typically, UTIs can be treated with medication. Because most UTIs are caused by a bacterial infection, they usually can be treated with the use of antibiotics. The choice of antibiotic and length of treatment depend on your symptoms and the type of bacteria causing your infection. HOME CARE INSTRUCTIONS  If you were prescribed antibiotics, take them exactly as your  caregiver instructs you. Finish the medication even if you feel better after you have only taken some of the medication.  Drink enough water and fluids to keep your urine clear or pale yellow.  Avoid caffeine, tea, and carbonated beverages. They tend to irritate your bladder.  Empty your bladder often. Avoid holding urine for long periods of time.  Empty your bladder before and after sexual intercourse.  After a bowel movement, women should cleanse from front to back. Use each tissue only once. SEEK MEDICAL CARE IF:   You have back pain.  You develop a fever.  Your symptoms do not begin to resolve within 3 days. SEEK IMMEDIATE MEDICAL CARE IF:   You have severe back pain or lower abdominal pain.  You develop chills.  You have nausea or vomiting.  You have continued burning or discomfort with urination. MAKE SURE YOU:   Understand these instructions.  Will watch your condition.  Will get help right away if you are not doing well or get worse.   This information is not intended to replace advice given to you by your health care provider. Make sure you discuss any questions you have with your health care provider.   Document Released: 03/01/2005 Document Revised: 02/10/2015 Document Reviewed: 06/30/2011 Elsevier Interactive Patient Education [employer].

## 2015-05-22 LAB — URINE CULTURE: Colony Count: 6000

## 2015-05-23 ENCOUNTER — Encounter: Payer: Self-pay | Admitting: Family Medicine

## 2015-05-24 ENCOUNTER — Other Ambulatory Visit: Payer: BLUE CROSS/BLUE SHIELD

## 2015-05-24 ENCOUNTER — Telehealth: Payer: Self-pay | Admitting: Family Medicine

## 2015-05-24 NOTE — Telephone Encounter (Signed)
Pt scheduled lab appt for today 

## 2015-05-24 NOTE — Telephone Encounter (Signed)
Pt was tx with cipro last week by Dr Diona Browner and still has symptoms I told her to call and then come in and drop off a specimen- I want to get ua and cx please for uti  thanks

## 2015-05-28 NOTE — Telephone Encounter (Signed)
Patient actually no showed her 05-19-15 appointment.  Routing to provider for final review. Will close encounter.

## 2015-06-09 ENCOUNTER — Encounter: Payer: Self-pay | Admitting: Internal Medicine

## 2015-06-09 NOTE — Progress Notes (Signed)
12/09/14- 67 yoF nonsmoker referred courtesy of [facility] for sleep medicine evaluation because of snoring and somnolence.  Medical problems include DDD Cspine Taking phentermine Complains of difficulty initiating and maintaining sleep, loud snoring, sleepiness while driving or at work. Frequently wakes at night. No sleep medicines. One cup of coffee in the morning. No ENT surgery. Denies my descriptions of cataplexy and sleep paralysis. Thyroid checked out normal.  02/04/15- 69 yoF nonsmoker referred courtesy of [facility] for sleep medicine evaluation because of snoring and somnolence. Follows For: Pt states she is here to review sleep study results. Pt has no new complaints at this time.  NPSG 01/19/15- AHI 0.0/ hr, moderate snoring, desat to 93%, some difficulty initiating and maintaining  Sleep, She feels she gets adequate sleep w/o med, admitting some restlessness at night and asking about effects of stress on sleep. Still oppressive daytime sleepiness- wants to nap, concerned about driving. No sleep paralysis or cataplexy. Discussed options and driving responsibility.  06/09/2015-45 year old female nonsmoker followed for snoring and insomnia with daytime sleepiness    ROS-see HPI   Negative unless "+" Constitutional:    weight loss, night sweats, fevers, chills, fatigue, lassitude. HEENT:    +headaches, difficulty swallowing, tooth/dental problems, sore throat,       sneezing, itching, ear ache, nasal congestion, post nasal drip, snoring CV:    chest pain, orthopnea, PND, swelling in lower extremities, anasarca,                                                      dizziness, +palpitations Resp:   shortness of breath with exertion or at rest.                productive cough,   non-productive cough, coughing up of blood.              change in color of mucus.  wheezing.   Skin:    rash or lesions. GI:  No-   heartburn, +indigestion, abdominal pain, nausea, vomiting, diarrhea,   change in bowel habits, loss of appetite GU: dysuria, change in color of urine, no urgency or frequency.   flank pain. MS:   joint pain, stiffness, decreased range of motion, back pain. Neuro-     nothing unusual Psych:  change in mood or affect.  +depression or anxiety.   memory loss.  OBJ- Physical Exam General- Alert, Oriented, Affect-appropriate, Distress- none acute Skin- rash-none, lesions- none, excoriation- none Lymphadenopathy- none Head- atraumatic            Eyes- Gross vision intact, PERRLA, conjunctivae and secretions clear            Ears- Hearing, canals-normal            Nose- Clear, no-Septal dev, mucus, polyps, erosion, perforation             Throat- Mallampati III , mucosa clear , drainage- none, tonsils- atrophic Neck- flexible , trachea midline, no stridor , thyroid nl, carotid no bruit Chest - symmetrical excursion , unlabored           Heart/CV- RRR , no murmur , no gallop  , no rub, nl s1 s2                           -  JVD- none , edema- none, stasis changes- none, varices- none           Lung- clear to P&A, wheeze- none, cough- none , dullness-none, rub- none           Chest wall-  Abd-  Br/ Gen/ Rectal- Not done, not indicated Extrem- cyanosis- none, clubbing, none, atrophy- none, strength- nl Neuro- grossly intact to observation

## 2015-06-25 ENCOUNTER — Other Ambulatory Visit: Payer: Self-pay | Admitting: Obstetrics and Gynecology

## 2015-06-25 ENCOUNTER — Telehealth: Payer: Self-pay | Admitting: Obstetrics and Gynecology

## 2015-06-25 NOTE — Telephone Encounter (Signed)
I would prefer to see her prior to calling in another script.

## 2015-06-25 NOTE — Telephone Encounter (Signed)
Patient was given a prescription for a bacterial infection and didn't finish because mom threw out bottle. Now she's having symptoms and wondering if another prescription can be called in or if she needs to be seen.

## 2015-06-25 NOTE — Telephone Encounter (Signed)
Patient has annual exam on Monday. Will close encounter.

## 2015-06-25 NOTE — Telephone Encounter (Signed)
Spoke with patient. She c/o increased vaginal discharge with odor. Last seen by Dr. Talbert Nan 04/28/15 and diagnosed with bacterial vaginosis. She did not complete treatment back in November because her tablets were mistakenly thrown in the trash. She states that symptoms have returned and requests a refill. Denies pain or fevers. Offered office visit today with Dr. Talbert Nan and patient declines. Patient has annual exam scheduled with Dr. Talbert Nan on Monday 06/28/15. Patient will keep appointment for annual exam.  Advised will send her request to Dr. Talbert Nan, will call back if Dr. Talbert Nan will place order for Flagyl before annual exam on Monday. Patient agreeable.

## 2015-06-28 ENCOUNTER — Ambulatory Visit: Payer: BLUE CROSS/BLUE SHIELD | Admitting: Obstetrics and Gynecology

## 2015-06-28 ENCOUNTER — Encounter: Payer: Self-pay | Admitting: Obstetrics and Gynecology

## 2015-06-28 ENCOUNTER — Telehealth: Payer: Self-pay | Admitting: Obstetrics and Gynecology

## 2015-06-28 NOTE — Telephone Encounter (Signed)
Patient called and cancelled her AEX today without 24 hours notice due to a "mandatory meeting at work." She rescheduled to 07/14/15.

## 2015-07-14 ENCOUNTER — Encounter: Payer: Self-pay | Admitting: Obstetrics and Gynecology

## 2015-07-14 ENCOUNTER — Telehealth: Payer: Self-pay | Admitting: Obstetrics and Gynecology

## 2015-07-14 ENCOUNTER — Ambulatory Visit (INDEPENDENT_AMBULATORY_CARE_PROVIDER_SITE_OTHER): Payer: BLUE CROSS/BLUE SHIELD | Admitting: Obstetrics and Gynecology

## 2015-07-14 ENCOUNTER — Other Ambulatory Visit: Payer: Self-pay | Admitting: Obstetrics and Gynecology

## 2015-07-14 VITALS — BP 138/76 | HR 72 | Resp 14 | Ht 67.5 in | Wt 179.0 lb

## 2015-07-14 DIAGNOSIS — Z01419 Encounter for gynecological examination (general) (routine) without abnormal findings: Secondary | ICD-10-CM

## 2015-07-14 DIAGNOSIS — Z0000 Encounter for general adult medical examination without abnormal findings: Secondary | ICD-10-CM

## 2015-07-14 DIAGNOSIS — R32 Unspecified urinary incontinence: Secondary | ICD-10-CM

## 2015-07-14 DIAGNOSIS — N920 Excessive and frequent menstruation with regular cycle: Secondary | ICD-10-CM | POA: Diagnosis not present

## 2015-07-14 LAB — POCT URINALYSIS DIPSTICK
Bilirubin, UA: NEGATIVE
Blood, UA: NEGATIVE
Glucose, UA: NEGATIVE
Ketones, UA: NEGATIVE
LEUKOCYTES UA: NEGATIVE
NITRITE UA: NEGATIVE
PROTEIN UA: NEGATIVE
UROBILINOGEN UA: NEGATIVE
pH, UA: 7

## 2015-07-14 LAB — COMPREHENSIVE METABOLIC PANEL
ALT: 32 U/L — ABNORMAL HIGH (ref 6–29)
AST: 23 U/L (ref 10–30)
Albumin: 4.6 g/dL (ref 3.6–5.1)
Alkaline Phosphatase: 60 U/L (ref 33–115)
BUN: 7 mg/dL (ref 7–25)
CHLORIDE: 99 mmol/L (ref 98–110)
CO2: 25 mmol/L (ref 20–31)
CREATININE: 0.83 mg/dL (ref 0.50–1.10)
Calcium: 9.9 mg/dL (ref 8.6–10.2)
Glucose, Bld: 82 mg/dL (ref 65–99)
Potassium: 4.2 mmol/L (ref 3.5–5.3)
SODIUM: 137 mmol/L (ref 135–146)
TOTAL PROTEIN: 7.5 g/dL (ref 6.1–8.1)
Total Bilirubin: 0.5 mg/dL (ref 0.2–1.2)

## 2015-07-14 LAB — CBC
HCT: 40 % (ref 36.0–46.0)
HEMOGLOBIN: 13.4 g/dL (ref 12.0–15.0)
MCH: 29.8 pg (ref 26.0–34.0)
MCHC: 33.5 g/dL (ref 30.0–36.0)
MCV: 89.1 fL (ref 78.0–100.0)
MPV: 9.3 fL (ref 8.6–12.4)
Platelets: 400 10*3/uL (ref 150–400)
RBC: 4.49 MIL/uL (ref 3.87–5.11)
RDW: 13.3 % (ref 11.5–15.5)
WBC: 8.2 10*3/uL (ref 4.0–10.5)

## 2015-07-14 LAB — TSH: TSH: 0.39 mIU/L — ABNORMAL LOW

## 2015-07-14 LAB — FERRITIN: Ferritin: 17 ng/mL (ref 10–232)

## 2015-07-14 NOTE — Progress Notes (Signed)
Patient ID: Adrienne Reese, female   DOB: [birth year], 45 y.o.   MRN: [id number] 45 y.o. [id number] DivorcedCaucasianF here for annual exam.  She c/o GSI for years, mild. Recently during intercourse, she has leaked urine several times, not positional. She doesn't feel it coming out, very thin, doesn't have an odor, but she is well hydrated. If she holds her urine too long she will leak on the way to the bathroom. No pain with intercourse, occasional bleeding or spotting with intercourse, off and on for years.  Period Cycle (Days): 28 Period Duration (Days): 6-7 days  Period Pattern: Regular Menstrual Flow: Heavy Menstrual Control: Tampon, Maxi pad Menstrual Control Change Freq (Hours): changes tampon and pad every hour the first three days  Dysmenorrhea: (!) Moderate Dysmenorrhea Symptoms: Cramping, Throbbing, Other (Comment)  Initially her cycle improved on the nuvaring, stopped helping so she stopped taking it.   Patient's last menstrual period was 06/25/2015.          Sexually active: Yes.    The current method of family planning is Essure (2012) Exercising: No.  The patient does not participate in regular exercise at present. Smoker:  no  Health Maintenance: Pap:  02-18-14 WNL History of abnormal Pap:  no MMG:  03-25-14 WNL Colonoscopy:  Never BMD:   Never TDaP:  09-12-10 Gardasil: N/A   reports that she has never smoked. She has never used smokeless tobacco. She reports that she drinks alcohol. She reports that she does not use illicit drugs.Rare ETOH. She is a Human resources officer, works from home. Kids are 4, 30, 48, 46 and 36 year olds.    Past Medical History  Diagnosis Date  . Anxiety   . H/O bladder infections   . Back pain   . Hemorrhoids   . Arthritis   . History of depression   . History of kidney stones   . Anemia   . 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 Dyspareunia     Past Surgical History  Procedure Laterality Date  . Cesarean section  2005  . Foot surgery   2001  . Cervical spine surgery  02/2013    Current Outpatient Prescriptions  Medication Sig Dispense Refill  . cetirizine (ZYRTEC) 10 MG tablet Take 10 mg by mouth as needed.     . Cholecalciferol (HM VITAMIN D3) 4000 UNITS CAPS Take 1 capsule by mouth daily.    Marland Kitchen escitalopram (LEXAPRO) 20 MG tablet TAKE 1 TABLET (20 MG TOTAL) BY MOUTH DAILY. 90 tablet 1  . vitamin B-12 (CYANOCOBALAMIN) 1000 MCG tablet Take 1,000 mcg by mouth daily.     No current facility-administered medications for this visit.    Family History  Problem Relation Age of Onset  . Positive PPD/TB Exposure Neg Hx   . Cancer Mother     breast - left dx at 32, 2nd breast cancer diagnosis at 56 (? new primary); and melanoma on right arm at 82.  . Breast cancer Mother   . Hyperlipidemia Mother   . Depression Mother   . Fibroids Mother   . Hypertension Brother   . Hyperlipidemia Brother   . Cancer Maternal Grandmother     colon cancer at age 6; and melanoma on left arm at age ?; reportedly told she has Lynch syndrome (no report available today)  . Arthritis Maternal Grandmother   . Hyperlipidemia Maternal Grandmother   . Hypertension Maternal Grandmother   . Heart disease Maternal Grandmother   . Heart attack Maternal  Grandmother   . Osteoporosis Maternal Grandmother   . Alcohol abuse Maternal Aunt   . Alcohol abuse Maternal Grandfather   . Depression Father   Mom with breast cancer at 20 and 104. She was BRCA negative, patient BRCA negative  Review of Systems  Constitutional: Negative.   HENT: Negative.   Eyes: Negative.   Respiratory: Negative.   Cardiovascular: Negative.   Gastrointestinal: Negative.   Endocrine: Negative.   Genitourinary: Positive for menstrual problem.       Heavy menstrual cycles Painful menstrual cycles Increased urinary incontinence   Musculoskeletal: Negative.   Skin: Negative.   Allergic/Immunologic: Negative.   Neurological: Negative.   Psychiatric/Behavioral: Negative.      Exam:   BP 138/76 mmHg  Pulse 72  Resp 14  Ht 5' 7.5" (1.715 m)  Wt 179 lb (81.194 kg)  BMI 27.61 kg/m2  LMP 06/25/2015  Weight change: _0 @ Height:   Height: 5' 7.5" (171.5 cm)  Ht Readings from Last 3 Encounters:  07/14/15 5' 7.5" (1.715 m)  05/20/15 _1  (1.727 m)  04/05/15 _2  (1.727 m)    General appearance: alert, cooperative and appears stated age Head: Normocephalic, without obvious abnormality, atraumatic Neck: no adenopathy, supple, symmetrical, trachea midline and thyroid normal to inspection and palpation Lungs: clear to auscultation bilaterally Breasts: normal appearance, no masses or tenderness Heart: regular rate and rhythm Abdomen: soft, non-tender; bowel sounds normal; no masses,  no organomegaly Extremities: extremities normal, atraumatic, no cyanosis or edema Skin: Skin color, texture, turgor normal. No rashes or lesions Lymph nodes: Cervical, supraclavicular, and axillary nodes normal. No abnormal inguinal nodes palpated Neurologic: Grossly normal   Pelvic: External genitalia:  no lesions              Urethra:  normal appearing urethra with no masses, tenderness or lesions              Bartholins and Skenes: normal                 Vagina: normal appearing vagina with normal color and discharge, no lesions              Cervix: no lesions               Bimanual Exam:  Uterus:  normal size, contour, position, consistency, mobility, non-tender and anteverted              Adnexa: no mass, fullness, tenderness               Rectovaginal: Confirms               Anus:  normal sphincter tone, no lesions  Chaperone was present for exam.  A:  Well Woman with normal exam  Menorrhagia  Family history of breast cancer  Urinary incontinence, long h/o mild GSI, now with occasional urge incontinence and leaking with intercourse  P:   Encourage breast self exam  Overdue for a mammogram  No pap this year  Return for ultrasound, possible  sonohysterogram, possible endometrial biopsy  Discussed calcium and vit D  Send urine for ua, c&s, empty her bladder prior to intercourse, discussed taking azo prior to intercourse to confirm she is leaking urine  If leakage persists, consider urodynamics  Kegel information given

## 2015-07-14 NOTE — Patient Instructions (Addendum)
Menorrhagia Menorrhagia is the medical term for when your menstrual periods are heavy or last longer than usual. With menorrhagia, every period you have may cause enough blood loss and cramping that you are unable to maintain your usual activities. CAUSES  In some cases, the cause of heavy periods is unknown, but a number of conditions may cause menorrhagia. Common causes include: 1. A problem with the hormone-producing thyroid gland (hypothyroid). 2. Noncancerous growths in the uterus (polyps or fibroids). 3. An imbalance of the estrogen and progesterone hormones. 4. One of your ovaries not releasing an egg during one or more months. 5. Side effects of having an intrauterine device (IUD). 6. Side effects of some medicines, such as anti-inflammatory medicines or blood thinners. 7. A bleeding disorder that stops your blood from clotting normally. SIGNS AND SYMPTOMS  During a normal period, bleeding lasts between 4 and 8 days. Signs that your periods are too heavy include:  You routinely have to change your pad or tampon every 1 or 2 hours because it is completely soaked.  You pass blood clots larger than 1 inch (2.5 cm) in size.  You have bleeding for more than 7 days.  You need to use pads and tampons at the same time because of heavy bleeding.  You need to wake up to change your pads or tampons during the night.  You have symptoms of anemia, such as tiredness, fatigue, or shortness of breath. DIAGNOSIS  Your health care provider will perform a physical exam and ask you questions about your symptoms and menstrual history. Other tests may be ordered based on what the health care provider finds during the exam. These tests can include:  Blood tests. Blood tests are used to check if you are pregnant or have hormonal changes, a bleeding or thyroid disorder, low iron levels (anemia), or other problems.  Endometrial biopsy. Your health care provider takes a sample of tissue from the inside  of your uterus to be examined under a microscope.  Pelvic ultrasound. This test uses sound waves to make a picture of your uterus, ovaries, and vagina. The pictures can show if you have fibroids or other growths.  Hysteroscopy. For this test, your health care provider will use a small telescope to look inside your uterus. Based on the results of your initial tests, your health care provider may recommend further testing. TREATMENT  Treatment may not be needed. If it is needed, your health care provider may recommend treatment with one or more medicines first. If these do not reduce bleeding enough, a surgical treatment might be an option. The best treatment for you will depend on:   Whether you need to prevent pregnancy.  Your desire to have children in the future.  The cause and severity of your bleeding.  Your opinion and personal preference.  Medicines for menorrhagia may include:  Birth control methods that use hormones. These include the pill, skin patch, vaginal ring, shots that you get every 3 months, hormonal IUD, and implant. These treatments reduce bleeding during your menstrual period.  Medicines that thicken blood and slow bleeding.  Medicines that reduce swelling, such as ibuprofen.  Medicines that contain a synthetic hormone called progestin.   Medicines that make the ovaries stop working for a short time.  You may need surgical treatment for menorrhagia if the medicines are unsuccessful. Treatment options include:  Dilation and curettage (D&C). In this procedure, your health care provider opens (dilates) your cervix and then scrapes or suctions tissue from  the lining of your uterus to reduce menstrual bleeding.  Operative hysteroscopy. This procedure uses a tiny tube with a light (hysteroscope) to view your uterine cavity and can help in the surgical removal of a polyp that may be causing heavy periods.  Endometrial ablation. Through various techniques, your  health care provider permanently destroys the entire lining of your uterus (endometrium). After endometrial ablation, most women have little or no menstrual flow. Endometrial ablation reduces your ability to become pregnant.  Endometrial resection. This surgical procedure uses an electrosurgical wire loop to remove the lining of the uterus. This procedure also reduces your ability to become pregnant.  Hysterectomy. Surgical removal of the uterus and cervix is a permanent procedure that stops menstrual periods. Pregnancy is not possible after a hysterectomy. This procedure requires anesthesia and hospitalization. HOME CARE INSTRUCTIONS   Only take over-the-counter or prescription medicines as directed by your health care provider. Take prescribed medicines exactly as directed. Do not change or switch medicines without consulting your health care provider.  Take any prescribed iron pills exactly as directed by your health care provider. Long-term heavy bleeding may result in low iron levels. Iron pills help replace the iron your body lost from heavy bleeding. Iron may cause constipation. If this becomes a problem, increase the bran, fruits, and roughage in your diet.  Do not take aspirin or medicines that contain aspirin 1 week before or during your menstrual period. Aspirin may make the bleeding worse.  If you need to change your sanitary pad or tampon more than once every 2 hours, stay in bed and rest as much as possible until the bleeding stops.  Eat well-balanced meals. Eat foods high in iron. Examples are leafy green vegetables, meat, liver, eggs, and whole grain breads and cereals. Do not try to lose weight until the abnormal bleeding has stopped and your blood iron level is back to normal. SEEK MEDICAL CARE IF:   You soak through a pad or tampon every 1 or 2 hours, and this happens every time you have a period.  You need to use pads and tampons at the same time because you are bleeding so  much.  You need to change your pad or tampon during the night.  You have a period that lasts for more than 8 days.  You pass clots bigger than 1 inch wide.  You have irregular periods that happen more or less often than once a month.  You feel dizzy or faint.  You feel very weak or tired.  You feel short of breath or feel your heart is beating too fast when you exercise.  You have nausea and vomiting or diarrhea while you are taking your medicine.  You have any problems that may be related to the medicine you are taking. SEEK IMMEDIATE MEDICAL CARE IF:   You soak through 4 or more pads or tampons in 2 hours.  You have any bleeding while you are pregnant. MAKE SURE YOU:   Understand these instructions.  Will watch your condition.  Will get help right away if you are not doing well or get worse.   This information is not intended to replace advice given to you by your health care provider. Make sure you discuss any questions you have with your health care provider.   Document Released: 05/22/2005 Document Revised: 05/27/2013 Document Reviewed: 11/10/2012 Elsevier Interactive Patient Education 2016 [place] AND DIET:  We recommended that you start or continue a regular exercise program  for good health. Regular exercise means any activity that makes your heart beat faster and makes you sweat.  We recommend exercising at least 30 minutes per day at least 3 days a week, preferably 4 or 5.  We also recommend a diet low in fat and sugar.  Inactivity, poor dietary choices and obesity can cause diabetes, heart attack, stroke, and kidney damage, among others.    ALCOHOL AND SMOKING:  Women should limit their alcohol intake to no more than 7 drinks/beers/glasses of wine (combined, not each!) per week. Moderation of alcohol intake to this level decreases your risk of breast cancer and liver damage. And of course, no recreational drugs are part of a healthy lifestyle.  And  absolutely no smoking or even second hand smoke. Most people know smoking can cause heart and lung diseases, but did you know it also contributes to weakening of your bones? Aging of your skin?  Yellowing of your teeth and nails?  CALCIUM AND VITAMIN D:  Adequate intake of calcium and Vitamin D are recommended.  The recommendations for exact amounts of these supplements seem to change often, but generally speaking 600 mg of calcium (either carbonate or citrate) and 800 units of Vitamin D per day seems prudent. Certain women may benefit from higher intake of Vitamin D.  If you are among these women, your doctor will have told you during your visit.    PAP SMEARS:  Pap smears, to check for cervical cancer or precancers,  have traditionally been done yearly, although recent scientific advances have shown that most women can have pap smears less often.  However, every woman still should have a physical exam from her gynecologist every year. It will include a breast check, inspection of the vulva and vagina to check for abnormal growths or skin changes, a visual exam of the cervix, and then an exam to evaluate the size and shape of the uterus and ovaries.  And after 43 years of age, a rectal exam is indicated to check for rectal cancers. We will also provide age appropriate advice regarding health maintenance, like when you should have certain vaccines, screening for sexually transmitted diseases, bone density testing, colonoscopy, mammograms, etc.   MAMMOGRAMS:  All women over 15 years old should have a yearly mammogram. Many facilities now offer a "3D" mammogram, which may cost around $50 extra out of pocket. If possible,  we recommend you accept the option to have the 3D mammogram performed.  It both reduces the number of women who will be called back for extra views which then turn out to be normal, and it is better than the routine mammogram at detecting truly abnormal areas.    COLONOSCOPY:  Colonoscopy to  screen for colon cancer is recommended for all women at age 65.  We know, you hate the idea of the prep.  We agree, BUT, having colon cancer and not knowing it is worse!!  Colon cancer so often starts as a polyp that can be seen and removed at colonscopy, which can quite literally save your life!  And if your first colonoscopy is normal and you have no family history of colon cancer, most women don't have to have it again for 10 years.  Once every ten years, you can do something that may end up saving your life, right?  We will be happy to help you get it scheduled when you are ready.  Be sure to check your insurance coverage so you understand how much it  will cost.  It may be covered as a preventative service at no cost, but you should check your particular policy.     Kegel Exercises The goal of Kegel exercises is to isolate and exercise your pelvic floor muscles. These muscles act as a hammock that supports the rectum, vagina, small intestine, and uterus. As the muscles weaken, the hammock sags and these organs are displaced from their normal positions. Kegel exercises can strengthen your pelvic floor muscles and help you to improve bladder and bowel control, improve sexual response, and help reduce many problems and some discomfort during pregnancy. Kegel exercises can be done anywhere and at any time. HOW TO PERFORM KEGEL EXERCISES 8. Locate your pelvic floor muscles. To do this, squeeze (contract) the muscles that you use when you try to stop the flow of urine. You will feel a tightness in the vaginal area (women) and a tight lift in the rectal area (men and women). 9. When you begin, contract your pelvic muscles tight for 2-5 seconds, then relax them for 2-5 seconds. This is one set. Do 4-5 sets with a short pause in between. 10. Contract your pelvic muscles for 8-10 seconds, then relax them for 8-10 seconds. Do 4-5 sets. If you cannot contract your pelvic muscles for 8-10 seconds, try 5-7 seconds and  work your way up to 8-10 seconds. Your goal is 4-5 sets of 10 contractions each day. Keep your stomach, buttocks, and legs relaxed during the exercises. Perform sets of both short and long contractions. Vary your positions. Perform these contractions 3-4 times per day. Perform sets while you are:   Lying in bed in the morning.  Standing at lunch.  Sitting in the late afternoon.  Lying in bed at night. You should do 40-50 contractions per day. Do not perform more Kegel exercises per day than recommended. Overexercising can cause muscle fatigue. Continue these exercises for for at least 15-20 weeks or as directed by your caregiver.   This information is not intended to replace advice given to you by your health care provider. Make sure you discuss any questions you have with your health care provider.   Document Released: 05/08/2012 Document Revised: 06/12/2014 Document Reviewed: 05/08/2012 Elsevier Interactive Patient Education [employer].

## 2015-07-14 NOTE — Telephone Encounter (Signed)
Spoke with pt regarding benefit for ultrasound,sonohysterogram and endometrial biopsy. Patient understood and agreeable. Patient ready to schedule. Patient scheduled 07/27/15 with DR Talbert Nan. Pt aware of arrival date and time. Pt aware of 72 hours cancellation policy with [id number] fee. No further questions. Ok to close

## 2015-07-15 LAB — URINALYSIS, MICROSCOPIC ONLY
Bacteria, UA: NONE SEEN [HPF]
CASTS: NONE SEEN [LPF]
CRYSTALS: NONE SEEN [HPF]
RBC / HPF: NONE SEEN RBC/HPF (ref <=2)
Squamous Epithelial / LPF: NONE SEEN [HPF] (ref <=5)
YEAST: NONE SEEN [HPF]

## 2015-07-15 LAB — VITAMIN D 25 HYDROXY (VIT D DEFICIENCY, FRACTURES): Vit D, 25-Hydroxy: 27 ng/mL — ABNORMAL LOW (ref 30–100)

## 2015-07-16 LAB — T3, FREE: T3 FREE: 3.1 pg/mL (ref 2.3–4.2)

## 2015-07-16 LAB — T4, FREE: Free T4: 1.1 ng/dL (ref 0.8–1.8)

## 2015-07-16 LAB — URINE CULTURE: Colony Count: 5000

## 2015-07-23 ENCOUNTER — Telehealth: Payer: Self-pay | Admitting: Obstetrics and Gynecology

## 2015-07-23 NOTE — Telephone Encounter (Signed)
Patient canceled her ultrasound. Pt is aware of the 72 hour cancellation policy.

## 2015-07-27 ENCOUNTER — Other Ambulatory Visit: Payer: BLUE CROSS/BLUE SHIELD | Admitting: Obstetrics and Gynecology

## 2015-07-27 ENCOUNTER — Other Ambulatory Visit: Payer: BLUE CROSS/BLUE SHIELD

## 2015-09-01 ENCOUNTER — Ambulatory Visit: Payer: Self-pay | Admitting: Family Medicine

## 2015-09-01 ENCOUNTER — Ambulatory Visit: Payer: BLUE CROSS/BLUE SHIELD | Admitting: Family Medicine

## 2015-09-15 ENCOUNTER — Ambulatory Visit (INDEPENDENT_AMBULATORY_CARE_PROVIDER_SITE_OTHER): Payer: BLUE CROSS/BLUE SHIELD | Admitting: Family Medicine

## 2015-09-15 ENCOUNTER — Encounter: Payer: Self-pay | Admitting: Family Medicine

## 2015-09-15 VITALS — BP 104/70 | HR 75 | Temp 98.2°F | Wt 180.8 lb

## 2015-09-15 DIAGNOSIS — N3000 Acute cystitis without hematuria: Secondary | ICD-10-CM | POA: Diagnosis not present

## 2015-09-15 DIAGNOSIS — R300 Dysuria: Secondary | ICD-10-CM

## 2015-09-15 LAB — POC URINALSYSI DIPSTICK (AUTOMATED)
Bilirubin, UA: NEGATIVE
Blood, UA: NEGATIVE
Glucose, UA: NEGATIVE
Ketones, UA: NEGATIVE
NITRITE UA: NEGATIVE
PH UA: 6.5
Spec Grav, UA: 1.01
UROBILINOGEN UA: 0.2

## 2015-09-15 MED ORDER — CIPROFLOXACIN HCL 250 MG PO TABS: 250.0000 mg | ORAL_TABLET | Freq: Two times a day (BID) | ORAL | Status: DC

## 2015-09-15 NOTE — Progress Notes (Signed)
BP 104/70 mmHg  Pulse 75  Temp(Src) 98.2 F (36.8 C) (Oral)  Wt 180 lb 12 oz (81.988 kg)  SpO2 98%  LMP 08/28/2015   CC: ?UTI  Subjective:    Patient ID: Adrienne Reese, female    DOB: 1970/08/13, 45 y.o.   MRN: [id number]  HPI: Adrienne Reese is a 45 y.o. female presenting on 09/15/2015 for Possible UTI; Dysuria; and Urinary Urgency   1 wk h/o frequency, urgency, nocturia, dysuria. No change in odor. No hematuria, fevers/chills, nausea/vomiting, flank pain, abd pain.   H/o frequent UTIs - latest sxs were 1 month ago, treated with azo OTC pills which resolved symptoms. Gets UTI >3 times yearly. No abx in last 3 months.   UCx insignificant growth on 07/2015, 05/2015 UCx with resistant E coli 02/2015.  Relevant past medical, surgical, family and social history reviewed and updated as indicated. Interim medical history since our last visit reviewed. Allergies and medications reviewed and updated. Current Outpatient Prescriptions on File Prior to Visit  Medication Sig  . cetirizine (ZYRTEC) 10 MG tablet Take 10 mg by mouth as needed.   . Cholecalciferol (HM VITAMIN D3) 4000 UNITS CAPS Take 1 capsule by mouth daily.  . vitamin B-12 (CYANOCOBALAMIN) 1000 MCG tablet Take 1,000 mcg by mouth daily.   No current facility-administered medications on file prior to visit.    Review of Systems Per HPI unless specifically indicated in ROS section     Objective:    BP 104/70 mmHg  Pulse 75  Temp(Src) 98.2 F (36.8 C) (Oral)  Wt 180 lb 12 oz (81.988 kg)  SpO2 98%  LMP 08/28/2015  Wt Readings from Last 3 Encounters:  09/15/15 180 lb 12 oz (81.988 kg)  07/14/15 179 lb (81.194 kg)  05/20/15 176 lb 12 oz (80.173 kg)    Physical Exam  Constitutional: She appears well-developed and well-nourished. No distress.  Abdominal: Soft. Normal appearance and bowel sounds are normal. She exhibits no distension and no mass. There is no hepatosplenomegaly. There is tenderness in the  suprapubic area. There is no rigidity, no rebound, no guarding, no CVA tenderness and negative Murphy's sign.  Skin: Skin is warm and dry. No rash noted.  Psychiatric: She has a normal mood and affect.  Nursing note and vitals reviewed.  Results for orders placed or performed in visit on 09/15/15  POCT Urinalysis Dipstick (Automated)  Result Value Ref Range   Color, UA Light Yellow    Clarity, UA Clear    Glucose, UA Negative    Bilirubin, UA Negative    Ketones, UA Negative    Spec Grav, UA 1.010    Blood, UA Negative    pH, UA 6.5    Protein, UA Trace    Urobilinogen, UA 0.2    Nitrite, UA Negative    Leukocytes, UA moderate (2+) (A) Negative      Assessment & Plan:   Problem List Items Addressed This Visit    UTI (urinary tract infection) - Primary    New UTI. H/o resistance to abx in the past (resistance to keflex 02/2015)- will rpt UCx. Treat with cipro 250mg  BID x 5 days.  Update if recurrent sxs. Discussed cranberry juice or tablets daily for prevention.  Return to discuss ppx abx with PCP if continued recurrent infections.      Relevant Orders   Urine culture    Other Visit Diagnoses    Dysuria        Relevant Orders  POCT Urinalysis Dipstick (Automated) (Completed)        Follow up plan: Return if symptoms worsen or fail to improve.  Ria Bush, MD

## 2015-09-15 NOTE — Patient Instructions (Signed)
You have repeat urine infection. Treat with cipro 250mg  twice daily for 5 days. push fluids. Urine culture sent. Return to discuss daily regimen with Dr Glori Bickers if recurrent urine infection.  Urinary Tract Infection Urinary tract infections (UTIs) can develop anywhere along your urinary tract. Your urinary tract is your body's drainage system for removing wastes and extra water. Your urinary tract includes two kidneys, two ureters, a bladder, and a urethra. Your kidneys are a pair of bean-shaped organs. Each kidney is about the size of your fist. They are located below your ribs, one on each side of your spine. CAUSES Infections are caused by microbes, which are microscopic organisms, including fungi, viruses, and bacteria. These organisms are so small that they can only be seen through a microscope. Bacteria are the microbes that most commonly cause UTIs. SYMPTOMS  Symptoms of UTIs may vary by age and gender of the patient and by the location of the infection. Symptoms in young women typically include a frequent and intense urge to urinate and a painful, burning feeling in the bladder or urethra during urination. Older women and men are more likely to be tired, shaky, and weak and have muscle aches and abdominal pain. A fever may mean the infection is in your kidneys. Other symptoms of a kidney infection include pain in your back or sides below the ribs, nausea, and vomiting. DIAGNOSIS To diagnose a UTI, your caregiver will ask you about your symptoms. Your caregiver will also ask you to provide a urine sample. The urine sample will be tested for bacteria and white blood cells. White blood cells are made by your body to help fight infection. TREATMENT  Typically, UTIs can be treated with medication. Because most UTIs are caused by a bacterial infection, they usually can be treated with the use of antibiotics. The choice of antibiotic and length of treatment depend on your symptoms and the type of bacteria  causing your infection. HOME CARE INSTRUCTIONS  If you were prescribed antibiotics, take them exactly as your caregiver instructs you. Finish the medication even if you feel better after you have only taken some of the medication.  Drink enough water and fluids to keep your urine clear or pale yellow.  Avoid caffeine, tea, and carbonated beverages. They tend to irritate your bladder.  Empty your bladder often. Avoid holding urine for long periods of time.  Empty your bladder before and after sexual intercourse.  After a bowel movement, women should cleanse from front to back. Use each tissue only once. SEEK MEDICAL CARE IF:   You have back pain.  You develop a fever.  Your symptoms do not begin to resolve within 3 days. SEEK IMMEDIATE MEDICAL CARE IF:   You have severe back pain or lower abdominal pain.  You develop chills.  You have nausea or vomiting.  You have continued burning or discomfort with urination. MAKE SURE YOU:   Understand these instructions.  Will watch your condition.  Will get help right away if you are not doing well or get worse.   This information is not intended to replace advice given to you by your health care provider. Make sure you discuss any questions you have with your health care provider.   Document Released: 03/01/2005 Document Revised: 02/10/2015 Document Reviewed: 06/30/2011 Elsevier Interactive Patient Education [employer].

## 2015-09-15 NOTE — Addendum Note (Signed)
Addended by: Ria Bush on: 09/15/2015 11:36 AM   Modules accepted: Miquel Dunn

## 2015-09-15 NOTE — Progress Notes (Signed)
Pre visit review using our clinic review tool, if applicable. No additional management support is needed unless otherwise documented below in the visit note. 

## 2015-09-15 NOTE — Assessment & Plan Note (Addendum)
New UTI. H/o resistance to abx in the past (resistance to keflex 02/2015)- will rpt UCx. Treat with cipro 250mg  BID x 5 days.  Update if recurrent sxs. Discussed cranberry juice or tablets daily for prevention.  Return to discuss ppx abx with PCP if continued recurrent infections.

## 2015-09-18 LAB — URINE CULTURE

## 2015-10-05 ENCOUNTER — Ambulatory Visit (INDEPENDENT_AMBULATORY_CARE_PROVIDER_SITE_OTHER): Payer: BLUE CROSS/BLUE SHIELD | Admitting: Family Medicine

## 2015-10-05 ENCOUNTER — Encounter: Payer: Self-pay | Admitting: Family Medicine

## 2015-10-05 VITALS — BP 106/68 | HR 77 | Temp 98.3°F | Ht 67.5 in | Wt 181.0 lb

## 2015-10-05 DIAGNOSIS — E663 Overweight: Secondary | ICD-10-CM

## 2015-10-05 DIAGNOSIS — R6889 Other general symptoms and signs: Secondary | ICD-10-CM | POA: Diagnosis not present

## 2015-10-05 DIAGNOSIS — N898 Other specified noninflammatory disorders of vagina: Secondary | ICD-10-CM

## 2015-10-05 LAB — POCT WET PREP (WET MOUNT): KOH Wet Prep POC: NEGATIVE

## 2015-10-05 MED ORDER — METRONIDAZOLE 0.75 % VA GEL: 1.0000 | Freq: Every day | VAGINAL | Status: DC

## 2015-10-05 NOTE — Assessment & Plan Note (Signed)
Pt finds it difficult to loose weight as she gets older with diet and exercise Making better food choices but does not always eat regularly and skips breakfast Suggest 5 d per week of exercise and regular smaller meals with protein Enc to check out myfitnesspal app and track calories to see how many it takes her to begin loosing

## 2015-10-05 NOTE — Progress Notes (Signed)
Pre visit review using our clinic review tool, if applicable. No additional management support is needed unless otherwise documented below in the visit note. 

## 2015-10-05 NOTE — Assessment & Plan Note (Signed)
Pt describes shakiness when /after aerobic exercise and feeling bad  She has seen endocrinology for low tsh  No sob or cp  Do suspect this could be from infrequent meals -perhaps hypoglycemia (disc symptoms of this) Recommend more frequent small meals with protein and update if no improvement with that

## 2015-10-05 NOTE — Assessment & Plan Note (Signed)
Clue cells on wet prep - tx with metrogel vaginal  Also gc/chlamydia probe sent  Recommend continued use of condoms

## 2015-10-05 NOTE — Progress Notes (Signed)
Subjective:    Patient ID: Adrienne Reese, female    DOB: 08/21/1970, 45 y.o.   MRN: [id number]  HPI Here for poss vaginitis -wonders about BV  Dr Talbert Nan is her gyn  Discharge Different color and odor than usual Milky color  No itching or burning No sores or bleeding  No low pelvic pain   Does not think she has been exposed to STDs Is active and uses condoms (once condom broke)  Is open to swabbing for gc/chlam  Partner was tested - neg   Had a bact infection back in Nov -this was similar   Was on abx for uti last month   Frustrated trying to loose wt Staying fit  She exercises regularly  Eating healthy  Wants to know what else she can do   Developing more exercise intolerance  Once HR gets past a certain point she feels too tired /shaky /dizzy  Also notices shaking in her hands more lately in general Lab Results  Component Value Date   TSH 0.39* 07/14/2015   saw a specialist for this  Was ok  Signed off on it  Is something to watch    Patient Active Problem List   Diagnosis Date Noted  . Vaginal discharge 10/05/2015  . Overweight 10/05/2015  . Pain, joint, multiple sites 10/05/2014  . 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 Snoring 09/18/2014  . B12 deficiency 09/18/2014  . UTI (urinary tract infection) 07/06/2014  . Melanoma of skin ([place]) 03/12/2014  . Family history of breast cancer 03/12/2014  . Family history of colon cancer 03/12/2014  . Family history of Lynch syndrome 02/18/2014  . Low TSH level 10/09/2013  . Menorrhagia 10/03/2013  . Fatigue 10/03/2013  . Adjustment disorder with mixed anxiety and depressed mood 10/03/2013  . Exercise intolerance 10/03/2013  . Seborrheic dermatitis 07/21/2013  . Hair loss 07/21/2013  . History of neck surgery 03/16/2013  . Papule 03/14/2013  . Vitamin D deficiency 02/17/2013  . Spinal stenosis in cervical region 02/17/2013  . DDD (degenerative disc disease), cervical 07/26/2012  . Family history  of breast cancer in mother 07/24/2012   Past Medical History  Diagnosis Date  . Anxiety   . H/O bladder infections   . Back pain   . Hemorrhoids   . Arthritis   . History of depression   . History of kidney stones   . Anemia   . Cancer ([place]) 2002    melanoma/basal cell  . Dyspareunia    Past Surgical History  Procedure Laterality Date  . Cesarean section  2005  . Foot surgery  2001  . Cervical spine surgery  02/2013   Social History  Substance Use Topics  . Smoking status: Never Smoker   . Smokeless tobacco: Never Used  . Alcohol Use: 0.0 oz/week    0 Standard drinks or equivalent per week     Comment: drinks once a month   Family History  Problem Relation Age of Onset  . Positive PPD/TB Exposure Neg Hx   . Cancer Mother     breast - left dx at 65, 2nd breast cancer diagnosis at 76 (? new primary); and melanoma on right arm at 38.  . Breast cancer Mother   . Hyperlipidemia Mother   . Depression Mother   . Fibroids Mother   . Hypertension Brother   . Hyperlipidemia Brother   . Cancer Maternal Grandmother     colon cancer at age 54; and melanoma on left arm at  age ?; reportedly told she has Lynch syndrome (no report available today)  . Arthritis Maternal Grandmother   . Hyperlipidemia Maternal Grandmother   . Hypertension Maternal Grandmother   . Heart disease Maternal Grandmother   . Heart attack Maternal Grandmother   . Osteoporosis Maternal Grandmother   . Alcohol abuse Maternal Aunt   . Alcohol abuse Maternal Grandfather   . Depression Father    Allergies  Allergen Reactions  . Ranitidine Hcl Hives and Swelling  . Clarithromycin Nausea And Vomiting  . Percocet [Oxycodone-Acetaminophen] Itching  . Sulfa Antibiotics Hives  . Vicodin [Hydrocodone-Acetaminophen] Nausea And Vomiting  . Zithromax [Azithromycin]     GI   Current Outpatient Prescriptions on File Prior to Visit  Medication Sig Dispense Refill  . cetirizine (ZYRTEC) 10 MG tablet Take 10 mg by  mouth as needed.     . Cholecalciferol (HM VITAMIN D3) 4000 UNITS CAPS Take 1 capsule by mouth daily.    . vitamin B-12 (CYANOCOBALAMIN) 1000 MCG tablet Take 1,000 mcg by mouth daily.     No current facility-administered medications on file prior to visit.    Review of Systems Review of Systems  Constitutional: Negative for fever, appetite change, fatigue and unexpected weight change.  Eyes: Negative for pain and visual disturbance.  Respiratory: Negative for cough and shortness of breath.   Cardiovascular: Negative for cp or palpitations    Gastrointestinal: Negative for nausea, diarrhea and constipation.  Genitourinary: Negative for urgency and frequency. pos for vaginal d/c without lesions or pain  Skin: Negative for pallor or rash   Neurological: Negative for weakness, light-headedness, numbness and headaches. pos for tremor that is worse during or after aerobic exercise  Hematological: Negative for adenopathy. Does not bruise/bleed easily.  Psychiatric/Behavioral: Negative for dysphoric mood. The patient is not nervous/anxious.         Objective:   Physical Exam  Constitutional: She appears well-developed and well-nourished. No distress.  overwt and well appearing   HENT:  Head: Normocephalic and atraumatic.  Eyes: Conjunctivae and EOM are normal. Pupils are equal, round, and reactive to light.  Neck: Normal range of motion. Neck supple. No JVD present. Carotid bruit is not present.  Cardiovascular: Normal rate, regular rhythm, normal heart sounds and intact distal pulses.  Exam reveals no gallop.   Pulmonary/Chest: Effort normal and breath sounds normal. No respiratory distress. She has no wheezes.  No crackles  Abdominal: Soft. Bowel sounds are normal. She exhibits no distension, no abdominal bruit and no mass. There is no tenderness. There is no rebound and no guarding.  No suprapubic tenderness or fullness    Genitourinary:  Nl labia  No lesions Mild amt of yellow to  white d/c  cx swab and wet prep obt   Wet prep pos for clue cells  Musculoskeletal: She exhibits no edema.  Neurological: She is alert. She has normal reflexes.  Skin: Skin is warm and dry. No rash noted.  Psychiatric: She has a normal mood and affect.          Assessment & Plan:   Problem List Items Addressed This Visit      Other   Exercise intolerance    Pt describes shakiness when /after aerobic exercise and feeling bad  She has seen endocrinology for low tsh  No sob or cp  Do suspect this could be from infrequent meals -perhaps hypoglycemia (disc symptoms of this) Recommend more frequent small meals with protein and update if no improvement with that  Overweight    Pt finds it difficult to loose weight as she gets older with diet and exercise Making better food choices but does not always eat regularly and skips breakfast Suggest 5 d per week of exercise and regular smaller meals with protein Enc to check out myfitnesspal app and track calories to see how many it takes her to begin loosing      Vaginal discharge - Primary    Clue cells on wet prep - tx with metrogel vaginal  Also gc/chlamydia probe sent  Recommend continued use of condoms        Relevant Orders   GC/Chlamydia Probe Amp

## 2015-10-05 NOTE — Patient Instructions (Addendum)
I sent metro gel vaginal to your pharmacy Use it for a week  Update me if no improvement  I sent off a gonorrhea and chlamydia test also -that is pending  Try the app called "myfitnesspal" to help with the weight loss you want to accomplish  Concentrate on smaller more frequent meals (especially with protein)  Make sure you have protein an hour before you exercise  If problems persist please let me know

## 2015-10-06 LAB — GC/CHLAMYDIA PROBE AMP
CT Probe RNA: NOT DETECTED
GC Probe RNA: NOT DETECTED

## 2015-10-08 ENCOUNTER — Ambulatory Visit (INDEPENDENT_AMBULATORY_CARE_PROVIDER_SITE_OTHER): Payer: BLUE CROSS/BLUE SHIELD | Admitting: Family Medicine

## 2015-10-08 ENCOUNTER — Encounter: Payer: Self-pay | Admitting: Family Medicine

## 2015-10-08 VITALS — BP 128/86 | HR 93 | Temp 98.3°F | Ht 67.5 in | Wt 179.5 lb

## 2015-10-08 DIAGNOSIS — J209 Acute bronchitis, unspecified: Secondary | ICD-10-CM | POA: Insufficient documentation

## 2015-10-08 MED ORDER — BENZONATATE 200 MG PO CAPS: 200.0000 mg | ORAL_CAPSULE | Freq: Three times a day (TID) | ORAL | Status: DC | PRN

## 2015-10-08 MED ORDER — PREDNISONE 10 MG PO TABS
ORAL_TABLET | ORAL | Status: DC
Start: 2015-10-08 — End: 2016-05-26

## 2015-10-08 NOTE — Progress Notes (Signed)
Pre visit review using our clinic review tool, if applicable. No additional management support is needed unless otherwise documented below in the visit note. 

## 2015-10-08 NOTE — Progress Notes (Signed)
Subjective:    Patient ID: Adrienne Reese, female    DOB: September 04, 1970, 45 y.o.   MRN: [id number]  HPI Here with uri symptoms    Caught it from family  A little cough on 5/2 Yesterday got much worse  Chest is burning and feels heavy  Cough -sounds junky but not bringing anything up  No fever  Head is tight - feels congested (cannot get anything out)  Some wheezing this am  A little hoarseness -mild   Throat is not sore   Tried some mucinex   Drinking lots of fluids/water   Patient Active Problem List   Diagnosis Date Noted  . Vaginal discharge 10/05/2015  . Overweight 10/05/2015  . Pain, joint, multiple sites 10/05/2014  . 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 Snoring 09/18/2014  . B12 deficiency 09/18/2014  . UTI (urinary tract infection) 07/06/2014  . Melanoma of skin ([place]) 03/12/2014  . Family history of breast cancer 03/12/2014  . Family history of colon cancer 03/12/2014  . Family history of Lynch syndrome 02/18/2014  . Low TSH level 10/09/2013  . Menorrhagia 10/03/2013  . Fatigue 10/03/2013  . Adjustment disorder with mixed anxiety and depressed mood 10/03/2013  . Exercise intolerance 10/03/2013  . Seborrheic dermatitis 07/21/2013  . Hair loss 07/21/2013  . History of neck surgery 03/16/2013  . Papule 03/14/2013  . Vitamin D deficiency 02/17/2013  . Spinal stenosis in cervical region 02/17/2013  . DDD (degenerative disc disease), cervical 07/26/2012  . Family history of breast cancer in mother 07/24/2012   Past Medical History  Diagnosis Date  . Anxiety   . H/O bladder infections   . Back pain   . Hemorrhoids   . Arthritis   . History of depression   . History of kidney stones   . Anemia   . Cancer ([place]) 2002    melanoma/basal cell  . Dyspareunia    Past Surgical History  Procedure Laterality Date  . Cesarean section  2005  . Foot surgery  2001  . Cervical spine surgery  02/2013   Social History  Substance Use Topics  . Smoking  status: Never Smoker   . Smokeless tobacco: Never Used  . Alcohol Use: 0.0 oz/week    0 Standard drinks or equivalent per week     Comment: drinks once a month   Family History  Problem Relation Age of Onset  . Positive PPD/TB Exposure Neg Hx   . Cancer Mother     breast - left dx at 21, 2nd breast cancer diagnosis at 51 (? new primary); and melanoma on right arm at 61.  . Breast cancer Mother   . Hyperlipidemia Mother   . Depression Mother   . Fibroids Mother   . Hypertension Brother   . Hyperlipidemia Brother   . Cancer Maternal Grandmother     colon cancer at age 30; and melanoma on left arm at age ?; reportedly told she has Lynch syndrome (no report available today)  . Arthritis Maternal Grandmother   . Hyperlipidemia Maternal Grandmother   . Hypertension Maternal Grandmother   . Heart disease Maternal Grandmother   . Heart attack Maternal Grandmother   . Osteoporosis Maternal Grandmother   . Alcohol abuse Maternal Aunt   . Alcohol abuse Maternal Grandfather   . Depression Father    Allergies  Allergen Reactions  . Ranitidine Hcl Hives and Swelling  . Clarithromycin Nausea And Vomiting  . Percocet [Oxycodone-Acetaminophen] Itching  . Sulfa Antibiotics Hives  .  Vicodin [Hydrocodone-Acetaminophen] Nausea And Vomiting  . Zithromax [Azithromycin]     GI   Current Outpatient Prescriptions on File Prior to Visit  Medication Sig Dispense Refill  . cetirizine (ZYRTEC) 10 MG tablet Take 10 mg by mouth as needed.     . Cholecalciferol (HM VITAMIN D3) 4000 UNITS CAPS Take 1 capsule by mouth daily.    Marland Kitchen escitalopram (LEXAPRO) 20 MG tablet Take 1 tablet by mouth daily.  1  . metroNIDAZOLE (METROGEL) 0.75 % vaginal gel Place 1 Applicatorful vaginally at bedtime. For 7 days 70 g 0  . vitamin B-12 (CYANOCOBALAMIN) 1000 MCG tablet Take 1,000 mcg by mouth daily.     No current facility-administered medications on file prior to visit.     Review of Systems  Constitutional:  Positive for appetite change and fatigue. Negative for fever.  HENT: Positive for congestion, postnasal drip, rhinorrhea, sinus pressure, sneezing and sore throat. Negative for ear pain.   Eyes: Negative for pain and discharge.  Respiratory: Positive for cough. Negative for shortness of breath, wheezing and stridor.   Cardiovascular: Negative for chest pain.  Gastrointestinal: Negative for nausea, vomiting and diarrhea.  Genitourinary: Negative for urgency, frequency and hematuria.  Musculoskeletal: Negative for myalgias and arthralgias.  Skin: Negative for rash.  Neurological: Positive for headaches. Negative for dizziness, weakness and light-headedness.  Psychiatric/Behavioral: Negative for confusion and dysphoric mood.   Review of Systems         Objective:   Physical Exam  Constitutional: She appears well-developed and well-nourished. No distress.  overwt and well but fatigued appearing   HENT:  Head: Normocephalic and atraumatic.  Right Ear: External ear normal.  Left Ear: External ear normal.  Mouth/Throat: Oropharynx is clear and moist.  Nares are injected and congested  No sinus tenderness Clear rhinorrhea and post nasal drip   Very hoarse voice  Eyes: Conjunctivae and EOM are normal. Pupils are equal, round, and reactive to light. Right eye exhibits no discharge. Left eye exhibits no discharge.  Neck: Normal range of motion. Neck supple.  Cardiovascular: Normal rate and normal heart sounds.   Pulmonary/Chest: Effort normal and breath sounds normal. No respiratory distress. She has no wheezes. She has no rales. She exhibits no tenderness.  Harsh bs   Lymphadenopathy:    She has no cervical adenopathy.  Neurological: She is alert.  Skin: Skin is warm and dry. No rash noted.  Psychiatric: She has a normal mood and affect.          Assessment & Plan:   Problem List Items Addressed This Visit      Respiratory   Acute bronchitis - Primary    I think you have  viral laryngitis and bronchitis  Reassuring exam  Drink lots of fluids Rest when you can  For cough try mucinex DM as directed and tessalon pills  If worse wheezing/tightness- till prednisone and take as directed   Update if not starting to improve in a week or if worsening  - esp if fevers or shortness of breat

## 2015-10-08 NOTE — Patient Instructions (Signed)
I think you have viral laryngitis and bronchitis  Drink lots of fluids Rest when you can  For cough try mucinex DM as directed and tessalon pills  If worse wheezing/tightness- till prednisone and take as directed   Update if not starting to improve in a week or if worsening  - esp if fevers or shortness of breath

## 2015-10-10 NOTE — Assessment & Plan Note (Signed)
I think you have viral laryngitis and bronchitis  Reassuring exam  Drink lots of fluids Rest when you can  For cough try mucinex DM as directed and tessalon pills  If worse wheezing/tightness- till prednisone and take as directed   Update if not starting to improve in a week or if worsening  - esp if fevers or shortness of breat

## 2015-10-11 ENCOUNTER — Telehealth: Payer: Self-pay | Admitting: Obstetrics and Gynecology

## 2015-10-11 NOTE — Telephone Encounter (Signed)
I spoke with patient and she said that her last cycle was much lighter. She wants to wait and see how her bleeding goes over the next few months. If it gets worse she will call us back.

## 2015-10-11 NOTE — Telephone Encounter (Signed)
Please call and check on the patient. At her annual she was c/o menorrhagia. Her CBC and TSH were normal. Please see how her cycles are and see if she wants to continue with w/u and treatment of her heavy cycles.

## 2015-10-15 ENCOUNTER — Encounter: Payer: Self-pay | Admitting: Family Medicine

## 2015-10-27 ENCOUNTER — Ambulatory Visit: Payer: Self-pay | Admitting: Internal Medicine

## 2015-10-27 DIAGNOSIS — Z0289 Encounter for other administrative examinations: Secondary | ICD-10-CM

## 2015-10-28 ENCOUNTER — Telehealth: Payer: Self-pay | Admitting: Family Medicine

## 2015-10-28 NOTE — Telephone Encounter (Signed)
Patient did not come for their scheduled appointment 10/27/15 for uti.  Please let me know if the patient needs to be contacted immediately for follow up or if no follow up is necessary.

## 2015-10-28 NOTE — Telephone Encounter (Signed)
No follow up needed

## 2015-11-16 ENCOUNTER — Other Ambulatory Visit: Payer: Self-pay | Admitting: Family Medicine

## 2015-11-16 NOTE — Telephone Encounter (Signed)
Please refill for 6 mo 

## 2015-11-16 NOTE — Telephone Encounter (Signed)
pts last anxiety f/u 07/2014. pls advise

## 2015-11-16 NOTE — Telephone Encounter (Signed)
done

## 2015-11-17 ENCOUNTER — Encounter: Payer: Self-pay | Admitting: Obstetrics and Gynecology

## 2016-03-08 ENCOUNTER — Ambulatory Visit: Payer: BLUE CROSS/BLUE SHIELD | Admitting: Primary Care

## 2016-03-08 ENCOUNTER — Telehealth: Payer: Self-pay | Admitting: Family Medicine

## 2016-03-08 DIAGNOSIS — Z0289 Encounter for other administrative examinations: Secondary | ICD-10-CM

## 2016-03-08 NOTE — Telephone Encounter (Signed)
Patient did not come in for their appointment today for uti.  Please let me know if patient needs to be contacted immediately for follow up or no follow up needed. °

## 2016-03-08 NOTE — Telephone Encounter (Signed)
No immediate follow-up necessary.

## 2016-03-20 ENCOUNTER — Encounter: Payer: Self-pay | Admitting: Obstetrics and Gynecology

## 2016-03-28 ENCOUNTER — Encounter: Payer: Self-pay | Admitting: Family Medicine

## 2016-03-28 ENCOUNTER — Telehealth: Payer: Self-pay | Admitting: Family Medicine

## 2016-03-28 MED ORDER — METRONIDAZOLE 500 MG PO TABS: 500.0000 mg | ORAL_TABLET | Freq: Two times a day (BID) | ORAL | 0 refills | Status: DC

## 2016-03-28 NOTE — Telephone Encounter (Signed)
Sent In flagy for BV

## 2016-05-26 ENCOUNTER — Ambulatory Visit (INDEPENDENT_AMBULATORY_CARE_PROVIDER_SITE_OTHER): Payer: BLUE CROSS/BLUE SHIELD | Admitting: Family Medicine

## 2016-05-26 ENCOUNTER — Encounter: Payer: Self-pay | Admitting: Family Medicine

## 2016-05-26 VITALS — BP 130/78 | HR 70 | Temp 98.3°F | Ht 67.5 in | Wt 170.2 lb

## 2016-05-26 DIAGNOSIS — N898 Other specified noninflammatory disorders of vagina: Secondary | ICD-10-CM | POA: Diagnosis not present

## 2016-05-26 LAB — POCT WET PREP (WET MOUNT)
KOH WET PREP POC: NEGATIVE
TRICHOMONAS WET PREP HPF POC: ABSENT

## 2016-05-26 MED ORDER — CLINDAMYCIN HCL 300 MG PO CAPS: 300.0000 mg | ORAL_CAPSULE | Freq: Two times a day (BID) | ORAL | 0 refills | Status: DC

## 2016-05-26 NOTE — Assessment & Plan Note (Signed)
Clue cells on wet prep/ BV noted again  ? If recurrent vs not entirely tx  Will look into getting partner treated  Also adj vag ph Trial of clindamycin 300 bid for 7 d If it re occurs again- vaginal boric acid may be an opt and/or proph metrogel  Will update

## 2016-05-26 NOTE — Patient Instructions (Signed)
It looks like you have BV again  Take the clindamycin twice daily for 7 days  If symptoms return again please let me know  We may want to try something else   You may want to get your partner checked/ treated  Look at otc products that help vaginal ph/ prevent vaginitis

## 2016-05-26 NOTE — Progress Notes (Signed)
Subjective:    Patient ID: Adrienne Reese, female    DOB: 05/16/1971, 45 y.o.   MRN: [id number]  HPI Here for vaginal symptoms   She has noticed a patten  Was tx in May - and symptoms went away  After menses she noted odor again and used the gel again  This keeps happening after her menses  Then treated with flagyl    This time- odor since Thursday of last week  Also discharge - color changes - almost a green tint  No irritation or itching or burning  Just finished period  ? If her partner could have it - no symptoms    GC/chlamydia tests neg in may Had clue cells at that time and tx with metrogel vaginal  Used to go to gyn  Pap was 11/16  Has not found a doctor in [place] yet    She has intentionally lost 10 lb ! Happy about that   Patient Active Problem List   Diagnosis Date Noted  . Vaginal discharge 10/05/2015  . Overweight 10/05/2015  . Pain, joint, multiple sites 10/05/2014  . 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 Snoring 09/18/2014  . B12 deficiency 09/18/2014  . Melanoma of skin ([place]) 03/12/2014  . Family history of breast cancer 03/12/2014  . Family history of colon cancer 03/12/2014  . Family history of Lynch syndrome 02/18/2014  . Low TSH level 10/09/2013  . Menorrhagia 10/03/2013  . Fatigue 10/03/2013  . Adjustment disorder with mixed anxiety and depressed mood 10/03/2013  . Exercise intolerance 10/03/2013  . Seborrheic dermatitis 07/21/2013  . Hair loss 07/21/2013  . History of neck surgery 03/16/2013  . Papule 03/14/2013  . Vitamin D deficiency 02/17/2013  . Spinal stenosis in cervical region 02/17/2013  . DDD (degenerative disc disease), cervical 07/26/2012  . Family history of breast cancer in mother 07/24/2012   Past Medical History:  Diagnosis Date  . Anemia   . Anxiety   . Arthritis   . Back pain   . Cancer ([place]) 2002   melanoma/basal cell  . Dyspareunia   . H/O bladder infections   . Hemorrhoids   . History of depression     . History of kidney stones    Past Surgical History:  Procedure Laterality Date  . CERVICAL SPINE SURGERY  02/2013  . CESAREAN SECTION  2005  . FOOT SURGERY  2001   Social History  Substance Use Topics  . Smoking status: Never Smoker  . Smokeless tobacco: Never Used  . Alcohol use 0.0 oz/week     Comment: drinks once a month   Family History  Problem Relation Age of Onset  . Positive PPD/TB Exposure Neg Hx   . Cancer Mother     breast - left dx at 37, 2nd breast cancer diagnosis at 20 (? new primary); and melanoma on right arm at 34.  . Breast cancer Mother   . Hyperlipidemia Mother   . Depression Mother   . Fibroids Mother   . Hypertension Brother   . Hyperlipidemia Brother   . Cancer Maternal Grandmother     colon cancer at age 20; and melanoma on left arm at age ?; reportedly told she has Lynch syndrome (no report available today)  . Arthritis Maternal Grandmother   . Hyperlipidemia Maternal Grandmother   . Hypertension Maternal Grandmother   . Heart disease Maternal Grandmother   . Heart attack Maternal Grandmother   . Osteoporosis Maternal Grandmother   . Alcohol abuse Maternal Aunt   .  Alcohol abuse Maternal Grandfather   . Depression Father    Allergies  Allergen Reactions  . Ranitidine Hcl Hives and Swelling  . Clarithromycin Nausea And Vomiting  . Percocet [Oxycodone-Acetaminophen] Itching  . Sulfa Antibiotics Hives  . Vicodin [Hydrocodone-Acetaminophen] Nausea And Vomiting  . Zithromax [Azithromycin]     GI   Current Outpatient Prescriptions on File Prior to Visit  Medication Sig Dispense Refill  . Cholecalciferol (HM VITAMIN D3) 4000 UNITS CAPS Take 1 capsule by mouth daily.    . vitamin B-12 (CYANOCOBALAMIN) 1000 MCG tablet Take 1,000 mcg by mouth daily.     No current facility-administered medications on file prior to visit.     Review of Systems    Review of Systems  Constitutional: Negative for fever, appetite change, fatigue and unexpected  weight change.  Eyes: Negative for pain and visual disturbance.  Respiratory: Negative for cough and shortness of breath.   Cardiovascular: Negative for cp or palpitations    Gastrointestinal: Negative for nausea, diarrhea and constipation.  Genitourinary: Negative for urgency and frequency. pos for vaginal d/c with odor, neg for pain or itching  Skin: Negative for pallor or rash   Neurological: Negative for weakness, light-headedness, numbness and headaches.  Hematological: Negative for adenopathy. Does not bruise/bleed easily.  Psychiatric/Behavioral: Negative for dysphoric mood. The patient is not nervous/anxious.       Objective:   Physical Exam  Constitutional: She appears well-developed and well-nourished. No distress.  Well appearing   Eyes: Conjunctivae and EOM are normal. Pupils are equal, round, and reactive to light. Right eye exhibits no discharge. Left eye exhibits no discharge.  Neck: Normal range of motion. Neck supple.  Cardiovascular: Normal rate and regular rhythm.   Pulmonary/Chest: Effort normal and breath sounds normal. She has no wheezes. She has no rales.  Abdominal: Soft. Bowel sounds are normal. She exhibits no distension. There is no tenderness.  No suprapubic tenderness or fullness    Genitourinary:  Genitourinary Comments: Nl vaginal mucosa Scant cloudy d/c Pos clue cells on wet prep and whiff   No erythema or lesions or tenderness  Lymphadenopathy:    She has no cervical adenopathy.  Neurological: She is alert.  Skin: Skin is warm and dry. No rash noted. No erythema.  Psychiatric: She has a normal mood and affect.          Assessment & Plan:   Problem List Items Addressed This Visit      Other   Vaginal discharge - Primary    Clue cells on wet prep/ BV noted again  ? If recurrent vs not entirely tx  Will look into getting partner treated  Also adj vag ph Trial of clindamycin 300 bid for 7 d If it re occurs again- vaginal boric acid may be  an opt and/or proph metrogel  Will update      Relevant Orders   POCT Wet Prep [employer])

## 2016-05-26 NOTE — Progress Notes (Signed)
Pre visit review using our clinic review tool, if applicable. No additional management support is needed unless otherwise documented below in the visit note. 

## 2016-07-17 ENCOUNTER — Encounter: Payer: Self-pay | Admitting: Family Medicine

## 2016-07-18 ENCOUNTER — Ambulatory Visit (INDEPENDENT_AMBULATORY_CARE_PROVIDER_SITE_OTHER): Payer: BLUE CROSS/BLUE SHIELD | Admitting: Family Medicine

## 2016-07-18 ENCOUNTER — Encounter: Payer: Self-pay | Admitting: Family Medicine

## 2016-07-18 VITALS — BP 90/64 | HR 91 | Temp 98.2°F | Ht 67.5 in | Wt 160.2 lb

## 2016-07-18 DIAGNOSIS — N760 Acute vaginitis: Secondary | ICD-10-CM

## 2016-07-18 DIAGNOSIS — N898 Other specified noninflammatory disorders of vagina: Secondary | ICD-10-CM

## 2016-07-18 LAB — POCT WET PREP (WET MOUNT): Trichomonas Wet Prep HPF POC: ABSENT

## 2016-07-18 MED ORDER — AMBULATORY NON FORMULARY MEDICATION: 0 refills | Status: AC

## 2016-07-18 MED ORDER — CLINDAMYCIN HCL 300 MG PO CAPS: 300.0000 mg | ORAL_CAPSULE | Freq: Two times a day (BID) | ORAL | 0 refills | Status: AC

## 2016-07-18 NOTE — Progress Notes (Signed)
   Subjective:    Patient ID: Adrienne Reese, female    DOB: 1970/09/19, 46 y.o.   MRN: [id number]  HPI   46 year old female pt of Dr. Marliss Coots presents for new onset vaginal smelling, discharge ( thicker, white)  in last 1-2 days. Just finished menses last week.  No itching. No burning.   She has had recurrent bacterial vaginosis in past.  Ongoing in last year.  Last OV with PCP 05/26/2016 SUMMARY: She has noticed a patten  Was tx in May - and symptoms went away  After menses she noted odor again and used the gel again  This keeps happening after her menses  Then treated with flagyl  ? If her partner could have it - no symptoms  GC/chlamydia tests neg in may Had clue cells at that time and tx with metrogel vaginal  Recurrent BVTreated 12/22 with clinda x 7 days this helped her go longer without symtpoms.     Review of Systems  Constitutional: Negative for fatigue and fever.  HENT: Negative for ear pain.   Eyes: Negative for pain.  Respiratory: Negative for chest tightness and shortness of breath.   Cardiovascular: Negative for chest pain, palpitations and leg swelling.  Gastrointestinal: Negative for abdominal pain.  Genitourinary: Negative for dysuria.       Objective:   Physical Exam  Constitutional: Vital signs are normal. She appears well-developed and well-nourished. She is cooperative.  Non-toxic appearance. She does not appear ill. No distress.  HENT:  Head: Normocephalic.  Right Ear: Hearing, tympanic membrane, external ear and ear canal normal. Tympanic membrane is not erythematous, not retracted and not bulging.  Left Ear: Hearing, tympanic membrane, external ear and ear canal normal. Tympanic membrane is not erythematous, not retracted and not bulging.  Nose: No mucosal edema or rhinorrhea. Right sinus exhibits no maxillary sinus tenderness and no frontal sinus tenderness. Left sinus exhibits no maxillary sinus tenderness and no frontal sinus tenderness.    Mouth/Throat: Uvula is midline, oropharynx is clear and moist and mucous membranes are normal.  Eyes: Conjunctivae, EOM and lids are normal. Pupils are equal, round, and reactive to light. Lids are everted and swept, no foreign bodies found.  Neck: Trachea normal and normal range of motion. Neck supple. Carotid bruit is not present. No thyroid mass and no thyromegaly present.  Cardiovascular: Normal rate, regular rhythm, S1 normal, S2 normal, normal heart sounds, intact distal pulses and normal pulses.  Exam reveals no gallop and no friction rub.   No murmur heard. Pulmonary/Chest: Effort normal and breath sounds normal. No tachypnea. No respiratory distress. She has no decreased breath sounds. She has no wheezes. She has no rhonchi. She has no rales.  Abdominal: Soft. Normal appearance and bowel sounds are normal. There is no tenderness.  Genitourinary: There is no rash or lesion on the right labia. There is no rash or lesion on the left labia. There is erythema in the vagina. No tenderness in the vagina. Vaginal discharge found.  Genitourinary Comments: Thin white discharge.  Neurological: She is alert.  Skin: Skin is warm, dry and intact. No rash noted.  Psychiatric: Her speech is normal and behavior is normal. Judgment and thought content normal. Her mood appears not anxious. Cognition and memory are normal. She does not exhibit a depressed mood.          Assessment & Plan:

## 2016-07-18 NOTE — Progress Notes (Signed)
Boric Acid Suppositories called into [facility].

## 2016-07-18 NOTE — Patient Instructions (Signed)
Compelete clindamycin for 7 days. Start 21 day course of vaginal boric acid suppositories.  Make follow up with Dr. Glori Bickers in 22-23 days ( ie. 1-2 days after vag suppositories completed.

## 2016-07-18 NOTE — Assessment & Plan Note (Addendum)
Per uptodate : For women who prefer preventive therapy instead of treatment of frequent episodes of BV, we suggest metronidazole orally for seven days; vaginal boric acid 600 mg is begun at the same time and continued for 21 days (Grade 2B). Patients are seen for follow-up a day or two after their last boric acid dose; if they are in remission, we immediately begin metronidazole gel twice weekly for four to six months as suppressive therapy.   Given clinda worked well in past I will treat with this, start boric acid and have pt follow up with PCP 1-day after she is done 21 day boric acid treatment.

## 2016-07-18 NOTE — Progress Notes (Signed)
Pre visit review using our clinic review tool, if applicable. No additional management support is needed unless otherwise documented below in the visit note. 

## 2020-11-08 ENCOUNTER — Encounter: Payer: BLUE CROSS/BLUE SHIELD | Admitting: Family Medicine

## 2020-11-26 ENCOUNTER — Ambulatory Visit: Payer: BC Managed Care – PPO | Admitting: Family Medicine

## 2020-12-29 ENCOUNTER — Other Ambulatory Visit: Payer: Self-pay

## 2020-12-29 ENCOUNTER — Emergency Department
Admission: EM | Admit: 2020-12-29 | Discharge: 2020-12-29 | Disposition: A | Discharge status: Home / Self Care | Payer: BC Managed Care – PPO | Attending: Student in an Organized Health Care Education/Training Program | Admitting: Student in an Organized Health Care Education/Training Program

## 2020-12-29 DIAGNOSIS — R21 Rash and other nonspecific skin eruption: Secondary | ICD-10-CM | POA: Diagnosis present

## 2020-12-29 DIAGNOSIS — Z85828 Personal history of other malignant neoplasm of skin: Secondary | ICD-10-CM | POA: Insufficient documentation

## 2020-12-29 DIAGNOSIS — T783XXA Angioneurotic edema, initial encounter: Secondary | ICD-10-CM | POA: Diagnosis not present

## 2020-12-29 LAB — CBC WITH DIFFERENTIAL/PLATELET
Abs Immature Granulocytes: 0.02 10*3/uL (ref 0.00–0.07)
Basophils Absolute: 0 10*3/uL (ref 0.0–0.1)
Basophils Relative: 0 %
Eosinophils Absolute: 0.1 10*3/uL (ref 0.0–0.5)
Eosinophils Relative: 1 %
HCT: 41.1 % (ref 36.0–46.0)
Hemoglobin: 14.7 g/dL (ref 12.0–15.0)
Immature Granulocytes: 0 %
Lymphocytes Relative: 27 %
Lymphs Abs: 2.4 10*3/uL (ref 0.7–4.0)
MCH: 31.5 pg (ref 26.0–34.0)
MCHC: 35.8 g/dL (ref 30.0–36.0)
MCV: 88.2 fL (ref 80.0–100.0)
Monocytes Absolute: 0.6 10*3/uL (ref 0.1–1.0)
Monocytes Relative: 6 %
Neutro Abs: 6 10*3/uL (ref 1.7–7.7)
Neutrophils Relative %: 66 %
Platelets: 317 10*3/uL (ref 150–400)
RBC: 4.66 MIL/uL (ref 3.87–5.11)
RDW: 12.4 % (ref 11.5–15.5)
WBC: 9.1 10*3/uL (ref 4.0–10.5)
nRBC: 0 % (ref 0.0–0.2)

## 2020-12-29 LAB — COMPREHENSIVE METABOLIC PANEL
ALT: 22 U/L (ref 0–44)
AST: 20 U/L (ref 15–41)
Albumin: 4.1 g/dL (ref 3.5–5.0)
Alkaline Phosphatase: 60 U/L (ref 38–126)
Anion gap: 9 (ref 5–15)
BUN: 10 mg/dL (ref 6–20)
CO2: 26 mmol/L (ref 22–32)
Calcium: 9.2 mg/dL (ref 8.9–10.3)
Chloride: 103 mmol/L (ref 98–111)
Creatinine, Ser: 0.81 mg/dL (ref 0.44–1.00)
GFR, Estimated: >60 mL/min (ref >60)
Glucose, Bld: 118 mg/dL — ABNORMAL HIGH (ref 70–99)
Potassium: 4.1 mmol/L (ref 3.5–5.1)
Sodium: 138 mmol/L (ref 135–145)
Total Bilirubin: 1.1 mg/dL (ref 0.3–1.2)
Total Protein: 7.6 g/dL (ref 6.5–8.1)

## 2020-12-29 MED ORDER — FAMOTIDINE 20 MG PO TABS: 20.0000 mg | ORAL_TABLET | Freq: Two times a day (BID) | ORAL | 0 refills | Status: AC

## 2020-12-29 MED ORDER — FAMOTIDINE IN NACL 20-0.9 MG/50ML-% IV SOLN
20.0000 mg | Freq: Once | INTRAVENOUS | Status: AC
  Administered 2020-12-29: 20 mg via INTRAVENOUS
  Filled 2020-12-29: qty 50

## 2020-12-29 MED ORDER — DEXAMETHASONE SODIUM PHOSPHATE 10 MG/ML IJ SOLN
10.0000 mg | Freq: Once | INTRAMUSCULAR | Status: AC
  Administered 2020-12-29: 10 mg via INTRAVENOUS
  Filled 2020-12-29: qty 1

## 2020-12-29 MED ORDER — PREDNISONE 10 MG PO TABS
ORAL_TABLET | ORAL | 0 refills | Status: DC
Start: 2020-12-29 — End: 2022-04-03

## 2020-12-29 NOTE — ED Triage Notes (Signed)
Pt comes pov with allergic reaction. States woke up this morning with swollen lips and a rash. Pt states rash has been all over her body in different places since she had covid. No trouble breathing at this time.

## 2020-12-29 NOTE — ED Provider Notes (Signed)
[facility] Emergency Department Provider Note  ____________________________________________   Event Date/Time   First MD Initiated Contact with Patient 12/29/20 [phone]     (approximate)  I have reviewed the triage vital signs and the nursing notes.   HISTORY  Chief Complaint Allergic Reaction   HPI Adrienne Reese is a 50 y.o. female presents to the ED with complaint of a rash that started in January after having COVID.  She states that she has tried over-the-counter medications without any relief.  Is also been seen at an urgent care where she was given Pepcid and a steroid taper.  Patient states that while she was on the steroids the rash improved greatly.  She states that it came back after finishing the steroids and has remained.  This morning she woke with her lips swollen along with her rash.  Patient denies any difficulty swallowing or breathing.  There are no new medications.  She rates her pain as a 0/10.       Past Medical History:  Diagnosis Date   Anemia    Anxiety    Arthritis    Back pain    Cancer ([place]) 2002   melanoma/basal cell   Dyspareunia    H/O bladder infections    Hemorrhoids    History of depression    History of kidney stones     Patient Active Problem List   Diagnosis Date Noted   Recurrent vaginitis 07/18/2016   Vaginal discharge 10/05/2015   Overweight 10/05/2015   Pain, joint, multiple sites 10/05/2014   Idiopathic hypersomnolence 10/05/2014   Snoring 09/18/2014   B12 deficiency 09/18/2014   Melanoma of skin ([place]) 03/12/2014   Family history of breast cancer 03/12/2014   Family history of colon cancer 03/12/2014   Family history of Lynch syndrome 02/18/2014   Low TSH level 10/09/2013   Menorrhagia 10/03/2013   Fatigue 10/03/2013   Adjustment disorder with mixed anxiety and depressed mood 10/03/2013   Exercise intolerance 10/03/2013   Seborrheic dermatitis 07/21/2013   Hair loss 07/21/2013   History of  neck surgery 03/16/2013   Papule 03/14/2013   Vitamin D deficiency 02/17/2013   Spinal stenosis in cervical region 02/17/2013   DDD (degenerative disc disease), cervical 07/26/2012   Family history of breast cancer in mother 07/24/2012    Past Surgical History:  Procedure Laterality Date   CERVICAL SPINE SURGERY  02/2013   CESAREAN SECTION  2005   FOOT SURGERY  2001    Prior to Admission medications   Medication Sig Start Date End Date Taking? Authorizing Provider  famotidine (PEPCID) 20 MG tablet Take 1 tablet (20 mg total) by mouth 2 (two) times daily. 12/29/20 12/29/21 Yes Silas Muff L, PA-C  predniSONE (DELTASONE) 10 MG tablet Take 6 tablets  today, on day 2 take 5 tablets, day 3 take 4 tablets, day 4 take 3 tablets, day 5 take  2 tablets and 1 tablet the last day 12/29/20  Yes Madalyn Rob, Samer Dutton L, PA-C  AMBULATORY NON FORMULARY MEDICATION Boric Acid Suppository 600 mg Insert 1 PV daily x 21 days 07/18/16   Bedsole, Amy E, MD  Cholecalciferol (HM VITAMIN D3) 4000 UNITS CAPS Take 1 capsule by mouth daily.    [provider]  clindamycin (CLEOCIN) 300 MG capsule Take 1 capsule (300 mg total) by mouth 2 (two) times daily. 07/18/16   Bedsole, Amy E, MD  Cyanocobalamin 5000 MCG CAPS Take by mouth every 3 (three) days.    [provider]  IRON PO Take 1 capsule by mouth daily. Ortho Molecular Reacted Iron    [provider]  Triamcinolone Acetonide (NASACORT ALLERGY 24HR NA) Place 1 spray into the nose daily as needed.    [provider]    Allergies Ranitidine hcl, Clarithromycin, Percocet [oxycodone-acetaminophen], Sulfa antibiotics, Vicodin [hydrocodone-acetaminophen], and Zithromax [azithromycin]  Family History  Problem Relation Age of Onset   Positive PPD/TB Exposure Neg Hx    Cancer Mother        breast - left dx at 10, 2nd breast cancer diagnosis at 60 (? new primary); and melanoma on right arm at 70.   Breast cancer Mother    Hyperlipidemia  Mother    Depression Mother    Fibroids Mother    Hypertension Brother    Hyperlipidemia Brother    Cancer Maternal Grandmother        colon cancer at age 8; and melanoma on left arm at age ?; reportedly told she has Lynch syndrome (no report available today)   Arthritis Maternal Grandmother    Hyperlipidemia Maternal Grandmother    Hypertension Maternal Grandmother    Heart disease Maternal Grandmother    Heart attack Maternal Grandmother    Osteoporosis Maternal Grandmother    Alcohol abuse Maternal Aunt    Alcohol abuse Maternal Grandfather    Depression Father     Social History Social History   Tobacco Use   Smoking status: Never   Smokeless tobacco: Never  Substance Use Topics   Alcohol use: Yes    Alcohol/week: 0.0 standard drinks    Comment: drinks once a month   Drug use: No    Review of Systems Constitutional: No fever/chills Eyes: No visual changes. ENT: No sore throat. Cardiovascular: Denies chest pain. Respiratory: Denies shortness of breath. Gastrointestinal: No abdominal pain.  No nausea, no vomiting.  No diarrhea.  Genitourinary: Negative for dysuria. Musculoskeletal: Negative for skeletal pain. Skin: Positive for rash.  Positive for swollen lips. Neurological: Negative for headaches, focal weakness or numbness.  ____________________________________________   PHYSICAL EXAM:  VITAL SIGNS: ED Triage Vitals  Enc Vitals Group     BP 12/29/20 0748 137/89     Pulse Rate 12/29/20 0748 85     Resp 12/29/20 0748 15     Temp 12/29/20 0748 98.5 F (36.9 C)     Temp Source 12/29/20 0748 Oral     SpO2 12/29/20 0748 97 %     Weight 12/29/20 0749 170 lb (77.1 kg)     Height 12/29/20 0749 '5\' 10"'$  (1.778 m)     Head Circumference --      Peak Flow --      Pain Score 12/29/20 0749 0     Pain Loc --      Pain Edu? --      Excl. in [place]? --     Constitutional: Alert and oriented. Well appearing and in no acute distress.  Patient is able to talk in complete  sentences without any difficulty or shortness of breath noted. Eyes: Conjunctivae are normal. PERRL. EOMI. Head: Atraumatic. Nose: No congestion/rhinnorhea. Mouth/Throat: Mucous membranes are moist.  Oropharynx non-erythematous.  No edema noted and uvula is midline.  Both upper and lower lips are mildly edematous.  No rash, vesicles or pustules are noted to lips. Neck: No stridor.   Hematological/Lymphatic/Immunilogical: No cervical lymphadenopathy. Cardiovascular: Normal rate, regular rhythm. Grossly normal heart sounds.  Good peripheral circulation. Respiratory: Normal respiratory effort.  No retractions. Lungs CTAB. Gastrointestinal:  Soft and nontender. No distention.  Musculoskeletal: His upper and lower extremities they have difficulty.  Normal gait was noted.  No edema noted lower extremities. Neurologic:  Normal speech and language. No gross focal neurologic deficits are appreciated. No gait instability. Skin:  Skin is warm, dry and intact.  There is an erythematous papular rash diffusely over the extremities and torso.  No vesicles or pustules are noted.  Edema to the upper and lower lips as described above. Psychiatric: Mood and affect are normal. Speech and behavior are normal.  ____________________________________________   LABS (all labs ordered are listed, but only abnormal results are displayed)  Labs Reviewed  COMPREHENSIVE METABOLIC PANEL - Abnormal; Notable for the following components:      Result Value   Glucose, Bld 118 (*)    All other components within normal limits  CBC WITH DIFFERENTIAL/PLATELET   ____________________________________________   PROCEDURES  Procedure(s) performed (including Critical Care):  Procedures   ____________________________________________   INITIAL IMPRESSION / ASSESSMENT AND PLAN / ED COURSE  As part of my medical decision making, I reviewed the following data within the electronic MEDICAL RECORD NUMBER Notes from prior ED visits  and [place] Controlled Substance Database  50 year old female presents to the ED with chronic rash.  Patient states that she has had a rash since January when she had COVID.  She has continued to have the rash.  She has been seen at urgent care where they placed her on Pepcid and a prednisone taper.  She states that while she was on the prednisone that this improved.  She denies any respiratory issues with the rash.  She woke this morning with edema to her lips but again with no respiratory issues.  Patient was given Pepcid and Decadron IV.  There was improvement prior to her discharge and we discussed the need to see a dermatologist for her continued rash.  Patient was discharged with Pepcid 20 mg twice daily #60 and a prednisone taper.  Patient may also follow-up with her PCP until she is able to see a dermatologist.  We also talked about an allergy specialist for further evaluation also.  ____________________________________________   FINAL CLINICAL IMPRESSION(S) / ED DIAGNOSES  Final diagnoses:  Angioedema, initial encounter  Rash and nonspecific skin eruption     ED Discharge Orders          Ordered    predniSONE (DELTASONE) 10 MG tablet        12/29/20 1126    famotidine (PEPCID) 20 MG tablet  2 times daily        12/29/20 1126             Note:  This document was prepared using Dragon voice recognition software and may include unintentional dictation errors.    Johnn Hai, PA-C 12/29/20 1328    Merlyn Lot, MD 12/29/20 [phone]

## 2020-12-29 NOTE — Discharge Instructions (Signed)
Call make appointment either with [place] or [place] dermatology.  A prescription for prednisone and Pepcid was sent to your pharmacy.  Take these as directed.  The Pepcid will be twice a day and prednisone is a taper.  You may also use topical steroid cream over-the-counter if any particular area is itching more than another.  Return to the emergency department immediately if any shortness of breath or difficulty breathing.

## 2021-01-15 ENCOUNTER — Emergency Department
Admission: EM | Admit: 2021-01-15 | Discharge: 2021-01-15 | Disposition: A | Discharge status: Home / Self Care | Payer: BC Managed Care – PPO | Attending: Emergency Medicine | Admitting: Emergency Medicine

## 2021-01-15 ENCOUNTER — Other Ambulatory Visit: Payer: Self-pay

## 2021-01-15 DIAGNOSIS — L509 Urticaria, unspecified: Secondary | ICD-10-CM | POA: Insufficient documentation

## 2021-01-15 DIAGNOSIS — Z85828 Personal history of other malignant neoplasm of skin: Secondary | ICD-10-CM | POA: Insufficient documentation

## 2021-01-15 DIAGNOSIS — Z8616 Personal history of COVID-19: Secondary | ICD-10-CM | POA: Insufficient documentation

## 2021-01-15 DIAGNOSIS — R21 Rash and other nonspecific skin eruption: Secondary | ICD-10-CM | POA: Diagnosis present

## 2021-01-15 MED ORDER — HYDROXYZINE HCL 25 MG PO TABS: 25.0000 mg | ORAL_TABLET | Freq: Three times a day (TID) | ORAL | 0 refills | Status: AC | PRN

## 2021-01-15 MED ORDER — METHYLPREDNISOLONE SODIUM SUCC 40 MG IJ SOLR
40.0000 mg | Freq: Once | INTRAMUSCULAR | Status: AC
  Administered 2021-01-15: 40 mg via INTRAMUSCULAR
  Filled 2021-01-15: qty 1

## 2021-01-15 MED ORDER — HYDROXYZINE HCL 50 MG PO TABS
50.0000 mg | ORAL_TABLET | Freq: Once | ORAL | Status: AC
  Administered 2021-01-15: 50 mg via ORAL
  Filled 2021-01-15: qty 1

## 2021-01-15 NOTE — ED Triage Notes (Signed)
Pt comes pov with rash. Has been seen here recently for same. No trouble breathing. States it started last night while she was sleeping. Rash on most of body.

## 2021-01-15 NOTE — Discharge Instructions (Addendum)
Read and follow discharge care instructions.  Take medication as directed.  Advised to follow with dermatology for definitive evaluation and treatment.

## 2021-01-15 NOTE — ED Provider Notes (Signed)
[facility] Emergency Department Provider Note  ____________________________________________   Event Date/Time   First MD Initiated Contact with Patient 01/15/21 [phone]     (approximate)  I have reviewed the triage vital signs and the nursing notes.   HISTORY  Chief Complaint Allergic Reaction    HPI Adrienne Reese is a 50 y.o. female patient presents with intermitting rash which started after contracting COVID-19 in January 2022.  Patient states she has taken over-the-counter medication without any notable relief.  Patient is seen by urgent care and holistic medicine center.  Patient was told by holistic medicine center that she has internal yeast infection causing the breakout of the rash.  Patient states condition clears with use of steroids but she is hesitant with long-term use of steroids.  Patient is not seeing a dermatologist as recommended by her last ER visit here on 12/24/2020.  Patient also does not have a PCP since arrival here in [place] 6 months ago.          Past Medical History:  Diagnosis Date   Anemia    Anxiety    Arthritis    Back pain    Cancer ([place]) 2002   melanoma/basal cell   Dyspareunia    H/O bladder infections    Hemorrhoids    History of depression    History of kidney stones     Patient Active Problem List   Diagnosis Date Noted   Recurrent vaginitis 07/18/2016   Vaginal discharge 10/05/2015   Overweight 10/05/2015   Pain, joint, multiple sites 10/05/2014   Idiopathic hypersomnolence 10/05/2014   Snoring 09/18/2014   B12 deficiency 09/18/2014   Melanoma of skin ([place]) 03/12/2014   Family history of breast cancer 03/12/2014   Family history of colon cancer 03/12/2014   Family history of Lynch syndrome 02/18/2014   Low TSH level 10/09/2013   Menorrhagia 10/03/2013   Fatigue 10/03/2013   Adjustment disorder with mixed anxiety and depressed mood 10/03/2013   Exercise intolerance 10/03/2013   Seborrheic  dermatitis 07/21/2013   Hair loss 07/21/2013   History of neck surgery 03/16/2013   Papule 03/14/2013   Vitamin D deficiency 02/17/2013   Spinal stenosis in cervical region 02/17/2013   DDD (degenerative disc disease), cervical 07/26/2012   Family history of breast cancer in mother 07/24/2012    Past Surgical History:  Procedure Laterality Date   CERVICAL SPINE SURGERY  02/2013   CESAREAN SECTION  2005   FOOT SURGERY  2001    Prior to Admission medications   Medication Sig Start Date End Date Taking? Authorizing Provider  hydrOXYzine (ATARAX/VISTARIL) 25 MG tablet Take 1 tablet (25 mg total) by mouth 3 (three) times daily as needed. 01/15/21  Yes Sable Feil, PA-C  AMBULATORY NON FORMULARY MEDICATION Boric Acid Suppository 600 mg Insert 1 PV daily x 21 days 07/18/16   Eliezer Lofts E, MD  Cholecalciferol (HM VITAMIN D3) 4000 UNITS CAPS Take 1 capsule by mouth daily.    [provider]  clindamycin (CLEOCIN) 300 MG capsule Take 1 capsule (300 mg total) by mouth 2 (two) times daily. 07/18/16   Bedsole, Amy E, MD  Cyanocobalamin 5000 MCG CAPS Take by mouth every 3 (three) days.    [provider]  famotidine (PEPCID) 20 MG tablet Take 1 tablet (20 mg total) by mouth 2 (two) times daily. 12/29/20 12/29/21  Johnn Hai, PA-C  IRON PO Take 1 capsule by mouth daily. Ortho Molecular Reacted Iron  [provider]  predniSONE (DELTASONE) 10 MG tablet Take 6 tablets  today, on day 2 take 5 tablets, day 3 take 4 tablets, day 4 take 3 tablets, day 5 take  2 tablets and 1 tablet the last day 12/29/20   Johnn Hai, PA-C  Triamcinolone Acetonide (NASACORT ALLERGY 24HR NA) Place 1 spray into the nose daily as needed.    [provider]    Allergies Ranitidine hcl, Clarithromycin, Percocet [oxycodone-acetaminophen], Sulfa antibiotics, Vicodin [hydrocodone-acetaminophen], and Zithromax [azithromycin]  Family History  Problem Relation Age of Onset    Positive PPD/TB Exposure Neg Hx    Cancer Mother        breast - left dx at 88, 2nd breast cancer diagnosis at 79 (? new primary); and melanoma on right arm at 25.   Breast cancer Mother    Hyperlipidemia Mother    Depression Mother    Fibroids Mother    Hypertension Brother    Hyperlipidemia Brother    Cancer Maternal Grandmother        colon cancer at age 40; and melanoma on left arm at age ?; reportedly told she has Lynch syndrome (no report available today)   Arthritis Maternal Grandmother    Hyperlipidemia Maternal Grandmother    Hypertension Maternal Grandmother    Heart disease Maternal Grandmother    Heart attack Maternal Grandmother    Osteoporosis Maternal Grandmother    Alcohol abuse Maternal Aunt    Alcohol abuse Maternal Grandfather    Depression Father     Social History Social History   Tobacco Use   Smoking status: Never   Smokeless tobacco: Never  Substance Use Topics   Alcohol use: Yes    Alcohol/week: 0.0 standard drinks    Comment: drinks once a month   Drug use: No    Review of Systems  Constitutional: No fever/chills Eyes: No visual changes. ENT: No sore throat. Cardiovascular: Denies chest pain. Respiratory: Denies shortness of breath. Gastrointestinal: No abdominal pain.  No nausea, no vomiting.  No diarrhea.  No constipation. Genitourinary: Negative for dysuria. Musculoskeletal: Negative for back pain. Skin: Positive  for rash. Neurological: Negative for headaches, focal weakness or numbness. Psychiatric: Anxiety. Allergic/Immunilogical: See extensive allergy list. ____________________________________________   PHYSICAL EXAM:  VITAL SIGNS: ED Triage Vitals [01/15/21 0805]  Enc Vitals Group     BP 110/89     Pulse Rate (!) 110     Resp 18     Temp 98.5 F (36.9 C)     Temp Source Oral     SpO2 98 %     Weight 170 lb (77.1 kg)     Height '5\' 8"'$  (1.727 m)     Head Circumference      Peak Flow      Pain Score 6     Pain Loc       Pain Edu?      Excl. in [place]?     Constitutional: Alert and oriented. Well appearing and in no acute distress. Eyes: Conjunctivae are normal. PERRL. EOMI. Head: Atraumatic. Nose: No congestion/rhinnorhea. Mouth/Throat: Mucous membranes are moist.  Oropharynx non-erythematous. Neck: No stridor.   Hematological/Lymphatic/Immunilogical: No cervical lymphadenopathy. Cardiovascular: Normal rate, regular rhythm. Grossly normal heart sounds.  Good peripheral circulation. Respiratory: Normal respiratory effort.  No retractions. Lungs CTAB. Gastrointestinal: Soft and nontender. No distention. No abdominal bruits. No CVA tenderness. Genitourinary: Deferred Musculoskeletal: No lower extremity tenderness nor edema.  No joint effusions. Neurologic:  Normal speech and language. No  gross focal neurologic deficits are appreciated. No gait instability. Skin:  Skin is warm, dry and intact. No rash noted. Psychiatric: Mood and affect are normal. Speech and behavior are normal.  ____________________________________________   LABS (all labs ordered are listed, but only abnormal results are displayed)  Labs Reviewed - No data to display ____________________________________________  EKG   ____________________________________________  RADIOLOGY I, Sable Feil, personally viewed and evaluated these images (plain radiographs) as part of my medical decision making, as well as reviewing the written report by the radiologist.  ED MD interpretation:    Official radiology report(s): No results found.  ____________________________________________   PROCEDURES  Procedure(s) performed (including Critical Care):  Procedures   ____________________________________________   INITIAL IMPRESSION / ASSESSMENT AND PLAN / ED COURSE  As part of my medical decision making, I reviewed the following data within the [place]         Patient presents with chronic rash for 6 months.   Also the rash as contacted COVID-19.  Patient denies any active signs or symptoms.  Advised patient against long-term use of steroids for this rash.  Advised to follow-up with dermatology for definitive evaluation and treatment.  Patient given a prescription for Atarax.  Return right ED if condition worsen before seeing dermatology.     ____________________________________________   FINAL CLINICAL IMPRESSION(S) / ED DIAGNOSES  Final diagnoses:  Urticaria     ED Discharge Orders          Ordered    hydrOXYzine (ATARAX/VISTARIL) 25 MG tablet  3 times daily PRN        01/15/21 [id number]             Note:  This document was prepared using Dragon voice recognition software and may include unintentional dictation errors.    Sable Feil, PA-C 01/15/21 0831    Vladimir Crofts, MD 01/15/21 [phone]

## 2021-11-10 ENCOUNTER — Other Ambulatory Visit: Payer: Self-pay | Admitting: Obstetrics and Gynecology

## 2021-11-10 DIAGNOSIS — Z1231 Encounter for screening mammogram for malignant neoplasm of breast: Secondary | ICD-10-CM

## 2021-11-29 ENCOUNTER — Ambulatory Visit: Payer: BC Managed Care – PPO

## 2021-12-01 ENCOUNTER — Ambulatory Visit: Payer: BC Managed Care – PPO

## 2021-12-05 ENCOUNTER — Ambulatory Visit: Payer: BC Managed Care – PPO

## 2022-04-03 ENCOUNTER — Ambulatory Visit
Admission: EM | Admit: 2022-04-03 | Discharge: 2022-04-03 | Disposition: A | Discharge status: Home / Self Care | Payer: BC Managed Care – PPO | Attending: Emergency Medicine | Admitting: Emergency Medicine

## 2022-04-03 DIAGNOSIS — M79605 Pain in left leg: Secondary | ICD-10-CM | POA: Diagnosis not present

## 2022-04-03 DIAGNOSIS — M542 Cervicalgia: Secondary | ICD-10-CM

## 2022-04-03 DIAGNOSIS — M5442 Lumbago with sciatica, left side: Secondary | ICD-10-CM

## 2022-04-03 DIAGNOSIS — G8929 Other chronic pain: Secondary | ICD-10-CM

## 2022-04-03 DIAGNOSIS — M79602 Pain in left arm: Secondary | ICD-10-CM | POA: Diagnosis not present

## 2022-04-03 MED ORDER — PREDNISONE 10 MG (21) PO TBPK: ORAL_TABLET | Freq: Every day | ORAL | 0 refills | Status: DC

## 2022-04-03 NOTE — ED Provider Notes (Signed)
Roderic Palau    CSN: [id number] Arrival date & time: 04/03/22  1634      History   Chief Complaint Chief Complaint  Patient presents with   Leg Pain   Arm Pain    HPI Adrienne Reese is a 51 y.o. female.  Patient presents with left arm pain radiating from her neck x 3 months, worse since yesterday.  She has good strength in her arm but feels her handgrip is weak when she tries to pick things up.  She also has left leg pain x 1 year.  No falls or trauma.  Her medical history includes cervical degenerative disc disease and cervical spine surgery in 2014.  She reports relief of her symptoms in the past with an oral steroid; last taken approximately one year ago.  She denies numbness, wounds, redness, bruising, swelling, or other symptoms.  She has not seen her neurosurgeon (in [place]) since 2014. The history is provided by the patient and medical records.    Past Medical History:  Diagnosis Date   Anemia    Anxiety    Arthritis    Back pain    Cancer ([place]) 2002   melanoma/basal cell   Dyspareunia    H/O bladder infections    Hemorrhoids    History of depression    History of kidney stones     Patient Active Problem List   Diagnosis Date Noted   Recurrent vaginitis 07/18/2016   Vaginal discharge 10/05/2015   Overweight 10/05/2015   Pain, joint, multiple sites 10/05/2014   Idiopathic hypersomnolence 10/05/2014   Snoring 09/18/2014   B12 deficiency 09/18/2014   Melanoma of skin ([place]) 03/12/2014   Family history of breast cancer 03/12/2014   Family history of colon cancer 03/12/2014   Family history of Lynch syndrome 02/18/2014   Low TSH level 10/09/2013   Menorrhagia 10/03/2013   Fatigue 10/03/2013   Adjustment disorder with mixed anxiety and depressed mood 10/03/2013   Exercise intolerance 10/03/2013   Seborrheic dermatitis 07/21/2013   Hair loss 07/21/2013   History of neck surgery 03/16/2013   Papule 03/14/2013   Vitamin D deficiency  02/17/2013   Spinal stenosis in cervical region 02/17/2013   DDD (degenerative disc disease), cervical 07/26/2012   Family history of breast cancer in mother 07/24/2012    Past Surgical History:  Procedure Laterality Date   CERVICAL SPINE SURGERY  02/2013   CESAREAN SECTION  2005   FOOT SURGERY  2001    OB History     Gravida  5   Para  5   Term  5   Preterm      AB      Living  5      SAB      IAB      Ectopic      Multiple      Live Births               Home Medications    Prior to Admission medications   Medication Sig Start Date End Date Taking? Authorizing Provider  predniSONE (STERAPRED UNI-PAK 21 TAB) 10 MG (21) TBPK tablet Take by mouth daily. As directed 04/03/22  Yes Sharion Balloon, NP  AMBULATORY NON FORMULARY MEDICATION Boric Acid Suppository 600 mg Insert 1 PV daily x 21 days 07/18/16   Eliezer Lofts E, MD  Cholecalciferol (HM VITAMIN D3) 4000 UNITS CAPS Take 1 capsule by mouth daily.    [provider]  clindamycin (CLEOCIN) 300  MG capsule Take 1 capsule (300 mg total) by mouth 2 (two) times daily. 07/18/16   Bedsole, Amy E, MD  Cyanocobalamin 5000 MCG CAPS Take by mouth every 3 (three) days.    [provider]  famotidine (PEPCID) 20 MG tablet Take 1 tablet (20 mg total) by mouth 2 (two) times daily. 12/29/20 12/29/21  Johnn Hai, PA-C  hydrOXYzine (ATARAX/VISTARIL) 25 MG tablet Take 1 tablet (25 mg total) by mouth 3 (three) times daily as needed. 01/15/21   Sable Feil, PA-C  IRON PO Take 1 capsule by mouth daily. Ortho Molecular Reacted Iron    [provider]  Triamcinolone Acetonide (NASACORT ALLERGY 24HR NA) Place 1 spray into the nose daily as needed.    [provider]    Family History Family History  Problem Relation Age of Onset   Positive PPD/TB Exposure Neg Hx    Cancer Mother        breast - left dx at 42, 2nd breast cancer diagnosis at 61 (? new primary); and melanoma on right arm at  53.   Breast cancer Mother    Hyperlipidemia Mother    Depression Mother    Fibroids Mother    Hypertension Brother    Hyperlipidemia Brother    Cancer Maternal Grandmother        colon cancer at age 40; and melanoma on left arm at age ?; reportedly told she has Lynch syndrome (no report available today)   Arthritis Maternal Grandmother    Hyperlipidemia Maternal Grandmother    Hypertension Maternal Grandmother    Heart disease Maternal Grandmother    Heart attack Maternal Grandmother    Osteoporosis Maternal Grandmother    Alcohol abuse Maternal Aunt    Alcohol abuse Maternal Grandfather    Depression Father     Social History Social History   Tobacco Use   Smoking status: Never   Smokeless tobacco: Never  Substance Use Topics   Alcohol use: Yes    Alcohol/week: 0.0 standard drinks of alcohol    Comment: drinks once a month   Drug use: No     Allergies   Ranitidine hcl, Clarithromycin, Percocet [oxycodone-acetaminophen], Sulfa antibiotics, Vicodin [hydrocodone-acetaminophen], and Zithromax [azithromycin]   Review of Systems Review of Systems  Constitutional:  Negative for chills and fever.  Respiratory:  Negative for cough and shortness of breath.   Cardiovascular:  Negative for chest pain and palpitations.  Musculoskeletal:  Positive for arthralgias, back pain and neck pain. Negative for gait problem and joint swelling.  Skin:  Negative for color change, rash and wound.  Neurological:  Negative for weakness and numbness.  All other systems reviewed and are negative.    Physical Exam Triage Vital Signs ED Triage Vitals  Enc Vitals Group     BP 04/03/22 1727 124/88     Pulse Rate 04/03/22 1715 83     Resp 04/03/22 1715 18     Temp 04/03/22 1715 97.9 F (36.6 C)     Temp src --      SpO2 04/03/22 1715 98 %     Weight 04/03/22 1725 150 lb (68 kg)     Height 04/03/22 1725 '5\' 8"'$  (1.727 m)     Head Circumference --      Peak Flow --      Pain Score 04/03/22  1725 5     Pain Loc --      Pain Edu? --      Excl. in [place]? --  No data found.  Updated Vital Signs BP 124/88   Pulse 83   Temp 97.9 F (36.6 C)   Resp 18   Ht '5\' 8"'$  (1.727 m)   Wt 150 lb (68 kg)   SpO2 98%   BMI 22.81 kg/m   Visual Acuity Right Eye Distance:   Left Eye Distance:   Bilateral Distance:    Right Eye Near:   Left Eye Near:    Bilateral Near:     Physical Exam Vitals and nursing note reviewed.  Constitutional:      General: She is not in acute distress.    Appearance: Normal appearance. She is well-developed. She is not ill-appearing.  HENT:     Mouth/Throat:     Mouth: Mucous membranes are moist.  Cardiovascular:     Rate and Rhythm: Normal rate and regular rhythm.     Heart sounds: Normal heart sounds.  Pulmonary:     Effort: Pulmonary effort is normal. No respiratory distress.     Breath sounds: Normal breath sounds.  Musculoskeletal:        General: No swelling, tenderness, deformity or signs of injury. Normal range of motion.     Cervical back: Neck supple.  Skin:    General: Skin is warm and dry.     Capillary Refill: Capillary refill takes less than 2 seconds.     Findings: No bruising, erythema, lesion or rash.  Neurological:     General: No focal deficit present.     Mental Status: She is alert and oriented to person, place, and time.     Sensory: No sensory deficit.     Motor: No weakness.     Gait: Gait normal.  Psychiatric:        Mood and Affect: Mood normal.        Behavior: Behavior normal.      UC Treatments / Results  Labs (all labs ordered are listed, but only abnormal results are displayed) Labs Reviewed - No data to display  EKG   Radiology No results found.  Procedures Procedures (including critical care time)  Medications Ordered in UC Medications - No data to display  Initial Impression / Assessment and Plan / UC Course  I have reviewed the triage vital signs and the nursing notes.  Pertinent labs &  imaging results that were available during my care of the patient were reviewed by me and considered in my medical decision making (see chart for details).   Left arm pain, neck pain, left leg pain, low back pain with sciatica.  Patient has a long history of neck and back pain.  She had neck surgery in 2014 in [place].  No recent falls or trauma.  Treating with prednisone taper as patient reports this has been successful treatment in the past.  Instructed her to follow-up with a neurosurgeon and contact information for on-call neurosurgeon provided.  Education provided on musculoskeletal pain.  Discussed other symptomatic treatment.  Patient agrees to plan of care.   Final Clinical Impressions(s) / UC Diagnoses   Final diagnoses:  Left arm pain  Neck pain  Left leg pain  Chronic bilateral low back pain with left-sided sciatica     Discharge Instructions      Take the prednisone as directed.  Follow-up with a neurosurgeon such as the one listed below.     ED Prescriptions     Medication Sig Dispense Auth. Provider   predniSONE (STERAPRED UNI-PAK 21 TAB) 10 MG (21) TBPK  tablet Take by mouth daily. As directed 21 tablet Sharion Balloon, NP      I have reviewed the PDMP during this encounter.   Sharion Balloon, NP 04/03/22 [phone]

## 2022-04-03 NOTE — Discharge Instructions (Addendum)
Take the prednisone as directed.  Follow-up with a neurosurgeon such as the one listed below.

## 2022-04-03 NOTE — ED Triage Notes (Signed)
Patient to Urgent Care with complaints of worsening left leg and arm pain, reports poor sleep last night due to discomfort. States that she did house work yesterday and believes she might have tweaked something.   Patient reports that she has dealt with cervical spine issues for over 15 years and believes she might have a pinched nerve. Arm pain has been present for approx 3 months.  Reports in the past the only relief she has had is from steroids.

## 2022-05-03 NOTE — Progress Notes (Deleted)
Referring Physician:  No referring provider defined for this encounter.  Primary Physician:  Pcp, No  History of Present Illness: 05/03/2022*** [name] has a history of skin CA, depression, and kidney stones.   History of cervical surgery in 2014 in [place].   She was seen in ED on 04/03/22 for 3 month history of neck and left arm pain. Was given prednisone taper from ED.        Duration: *** Location: *** Quality: *** Severity: ***  Precipitating: aggravated by *** Modifying factors: made better by *** Weakness: none Timing: *** Bowel/Bladder Dysfunction: none  Conservative measures:  Physical therapy: ***  Multimodal medical therapy including regular antiinflammatories: ***  Injections: *** epidural steroid injections  Past Surgery: cervical surgery in [place] in 2014  Adrienne Reese has ***no symptoms of cervical myelopathy.  The symptoms are causing a significant impact on the patient's life.   Review of Systems:  A 10 point review of systems is negative, except for the pertinent positives and negatives detailed in the HPI.  Past Medical History: Past Medical History:  Diagnosis Date   Anemia    Anxiety    Arthritis    Back pain    Cancer ([place]) 2002   melanoma/basal cell   Dyspareunia    H/O bladder infections    Hemorrhoids    History of depression    History of kidney stones     Past Surgical History: Past Surgical History:  Procedure Laterality Date   CERVICAL SPINE SURGERY  02/2013   CESAREAN SECTION  2005   FOOT SURGERY  2001    Allergies: Allergies as of 05/05/2022 - Review Complete 04/03/2022  Allergen Reaction Noted   Ranitidine hcl Hives and Swelling 07/12/2011   Clarithromycin Nausea And Vomiting 07/12/2011   Percocet [oxycodone-acetaminophen] Itching 12/09/2014   Sulfa antibiotics Hives 07/12/2011   Vicodin [hydrocodone-acetaminophen] Nausea And Vomiting    Zithromax [azithromycin]  07/21/2013     Medications: Outpatient Encounter Medications as of 05/05/2022  Medication Sig   AMBULATORY NON FORMULARY MEDICATION Boric Acid Suppository 600 mg Insert 1 PV daily x 21 days   Cholecalciferol (HM VITAMIN D3) 4000 UNITS CAPS Take 1 capsule by mouth daily.   clindamycin (CLEOCIN) 300 MG capsule Take 1 capsule (300 mg total) by mouth 2 (two) times daily.   Cyanocobalamin 5000 MCG CAPS Take by mouth every 3 (three) days.   famotidine (PEPCID) 20 MG tablet Take 1 tablet (20 mg total) by mouth 2 (two) times daily.   hydrOXYzine (ATARAX/VISTARIL) 25 MG tablet Take 1 tablet (25 mg total) by mouth 3 (three) times daily as needed.   IRON PO Take 1 capsule by mouth daily. Ortho Molecular Reacted Iron   predniSONE (STERAPRED UNI-PAK 21 TAB) 10 MG (21) TBPK tablet Take by mouth daily. As directed   Triamcinolone Acetonide (NASACORT ALLERGY 24HR NA) Place 1 spray into the nose daily as needed.   No facility-administered encounter medications on file as of 05/05/2022.    Social History: Social History   Tobacco Use   Smoking status: Never   Smokeless tobacco: Never  Substance Use Topics   Alcohol use: Yes    Alcohol/week: 0.0 standard drinks of alcohol    Comment: drinks once a month   Drug use: No    Family Medical History: Family History  Problem Relation Age of Onset   Positive PPD/TB Exposure Neg Hx    Cancer Mother        breast - left  dx at 70, 2nd breast cancer diagnosis at 54 (? new primary); and melanoma on right arm at 24.   Breast cancer Mother    Hyperlipidemia Mother    Depression Mother    Fibroids Mother    Hypertension Brother    Hyperlipidemia Brother    Cancer Maternal Grandmother        colon cancer at age 20; and melanoma on left arm at age ?; reportedly told she has Lynch syndrome (no report available today)   Arthritis Maternal Grandmother    Hyperlipidemia Maternal Grandmother    Hypertension Maternal Grandmother    Heart disease Maternal Grandmother     Heart attack Maternal Grandmother    Osteoporosis Maternal Grandmother    Alcohol abuse Maternal Aunt    Alcohol abuse Maternal Grandfather    Depression Father     Physical Examination: There were no vitals filed for this visit.  General: Patient is well developed, well nourished, calm, collected, and in no apparent distress. Attention to examination is appropriate.  Respiratory: Patient is breathing without any difficulty.   NEUROLOGICAL:     Awake, alert, oriented to person, place, and time.  Speech is clear and fluent. Fund of knowledge is appropriate.   Cranial Nerves: Pupils equal round and reactive to light.  Facial tone is symmetric.  Facial sensation is symmetric.  ROM of spine:  *** ROM of cervical spine *** pain *** ROM of lumbar spine *** pain  No abnormal lesions on exposed skin.   Strength: Side Biceps Triceps Deltoid Interossei Grip Wrist Ext. Wrist Flex.  R '5 5 5 5 5 5 5  '$ L '5 5 5 5 5 5 5   '$ Side Iliopsoas Quads Hamstring PF DF EHL  R '5 5 5 5 5 5  '$ L '5 5 5 5 5 5   '$ Reflexes are ***2+ and symmetric at the biceps, triceps, brachioradialis, patella and achilles.   Hoffman's is absent.  Clonus is not present.   Bilateral upper and lower extremity sensation is intact to light touch.    No evidence of dysmetria noted.  Gait is normal.   ***No difficulty with tandem gait.    Medical Decision Making  Imaging: No recent cervical imaging.   I have personally reviewed the images and agree with the above interpretation.  Assessment and Plan: Adrienne Reese is a pleasant 51 y.o. female with ***  Treatment options discussed with patient and following plan made:   - Order for physical therapy for *** spine ***. - Continue on current medications including ***. Reviewed proper dosing along with risks and benefits. Take and NSAIDs with food.      I spent a total of *** minutes in face-to-face and non-face-to-face activities related to this patient's care toda  including review of outside records, review of imaging, review of symptoms, physical exam, discussion of differential diagnosis, discussion of treatment options, and documentation.   Thank you for involving me in the care of this patient.   Geronimo Boot PA-C Dept. of Neurosurgery

## 2022-05-05 ENCOUNTER — Ambulatory Visit: Payer: BLUE CROSS/BLUE SHIELD | Admitting: Orthopedic Surgery

## 2023-04-19 ENCOUNTER — Ambulatory Visit
Admission: EM | Admit: 2023-04-19 | Discharge: 2023-04-19 | Disposition: A | Discharge status: Home / Self Care | Payer: BC Managed Care – PPO

## 2023-04-19 ENCOUNTER — Ambulatory Visit: Payer: BC Managed Care – PPO

## 2023-04-19 DIAGNOSIS — M542 Cervicalgia: Secondary | ICD-10-CM | POA: Diagnosis not present

## 2023-04-19 NOTE — Discharge Instructions (Signed)
Your pain is most likely caused by irritation to the muscles .  If you choose to move forward with medications you may take ibuprofen 600 to 800 mg every 6-8 hours and/or Tylenol 500 to 1000 mg every 6 hours  You may use heating pad in 15 minute intervals as needed for additional comfort, within the first 2-3 days you may find comfort in using ice in 10-15 minutes over affected area  Begin massaging or stretching affected area daily for 10 minutes as tolerated to further loosen muscles   When sitting or lying down place pillow in the neck  Can try sleeping without pillow on firm mattress as this keeps the spine in alignment and helps reduce pain for some  Practice good posture: head back, shoulders back, chest forward, pelvis back and weight distributed evenly on both legs  If pain persist after recommended treatment or reoccurs if may be beneficial to follow up with orthopedic specialist for evaluation, this doctor specializes in the bones and can manage your symptoms long-term with options such as but not limited to imaging, medications or physical therapy
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
## 2023-05-09 ENCOUNTER — Ambulatory Visit
Admission: EM | Admit: 2023-05-09 | Discharge: 2023-05-09 | Disposition: A | Discharge status: Home / Self Care | Payer: BC Managed Care – PPO | Attending: Emergency Medicine | Admitting: Emergency Medicine

## 2023-05-09 DIAGNOSIS — J0100 Acute maxillary sinusitis, unspecified: Secondary | ICD-10-CM

## 2023-05-09 DIAGNOSIS — J069 Acute upper respiratory infection, unspecified: Secondary | ICD-10-CM

## 2023-05-09 MED ORDER — DOXYCYCLINE HYCLATE 100 MG PO CAPS: 100.0000 mg | ORAL_CAPSULE | Freq: Two times a day (BID) | ORAL | 0 refills | Status: DC

## 2023-05-09 NOTE — Discharge Instructions (Signed)
Take the doxycycline as directed.    Follow up with your primary care provider if your symptoms are not improving.    

## 2023-05-09 NOTE — ED Triage Notes (Signed)
Patient to Urgent Care with complaints of cough/ nasal and head congestion. Reports sinus pressure that radiates into her teeth. Reports some shortness of breath/ difficulty taking a deep breath. Denies any known fevers.  Symptoms started over two weeks ago.   Has tried Advil allergy and sinus w/ little relief.

## 2023-05-09 NOTE — ED Provider Notes (Signed)
Adrienne Reese    CSN: [id number] Arrival date & time: 05/09/23  1836      History   Chief Complaint Chief Complaint  Patient presents with   Nasal Congestion   Wheezing    HPI Adrienne Reese is a 52 y.o. female.  Patient presents with 2-week history of sinus congestion, sinus pressure, sinus pain, cough.  The cough is getting worse and she feels like it is getting into her chest.  She reports mild shortness of breath at times.  Treatment attempted with OTC sinus medication.  No fever, chest pain, or other symptoms.    The history is provided by the patient and medical records.    Past Medical History:  Diagnosis Date   Anemia    Anxiety    Arthritis    Back pain    Cancer (HCC) 2002   melanoma/basal cell   Dyspareunia    H/O bladder infections    Hemorrhoids    History of depression    History of kidney stones     Patient Active Problem List   Diagnosis Date Noted   Recurrent vaginitis 07/18/2016   Vaginal discharge 10/05/2015   Overweight 10/05/2015   Pain, joint, multiple sites 10/05/2014   Idiopathic hypersomnolence 10/05/2014   Snoring 09/18/2014   B12 deficiency 09/18/2014   Melanoma of skin (HCC) 03/12/2014   Family history of breast cancer 03/12/2014   Family history of colon cancer 03/12/2014   Family history of Lynch syndrome 02/18/2014   Low TSH level 10/09/2013   Menorrhagia 10/03/2013   Fatigue 10/03/2013   Adjustment disorder with mixed anxiety and depressed mood 10/03/2013   Exercise intolerance 10/03/2013   Seborrheic dermatitis 07/21/2013   Hair loss 07/21/2013   History of neck surgery 03/16/2013   Papule 03/14/2013   Vitamin D deficiency 02/17/2013   Spinal stenosis in cervical region 02/17/2013   DDD (degenerative disc disease), cervical 07/26/2012   Family history of breast cancer in mother 07/24/2012    Past Surgical History:  Procedure Laterality Date   CERVICAL SPINE SURGERY  02/2013   CESAREAN SECTION  2005    FOOT SURGERY  2001    OB History     Gravida  5   Para  5   Term  5   Preterm      AB      Living  5      SAB      IAB      Ectopic      Multiple      Live Births               Home Medications    Prior to Admission medications   Medication Sig Start Date End Date Taking? Authorizing Provider  doxycycline (VIBRAMYCIN) 100 MG capsule Take 1 capsule (100 mg total) by mouth 2 (two) times daily. 05/09/23  Yes Mickie Bail, NP  AMBULATORY NON FORMULARY MEDICATION Boric Acid Suppository 600 mg Insert 1 PV daily x 21 days 07/18/16   Kerby Nora E, MD  Cholecalciferol (HM VITAMIN D3) 4000 UNITS CAPS Take 1 capsule by mouth daily.    [provider]  clindamycin (CLEOCIN) 300 MG capsule Take 1 capsule (300 mg total) by mouth 2 (two) times daily. 07/18/16   Bedsole, Amy E, MD  Cyanocobalamin 5000 MCG CAPS Take by mouth every 3 (three) days.    [provider]  famotidine (PEPCID) 20 MG tablet Take 1 tablet (20 mg total) by mouth  2 (two) times daily. 12/29/20 12/29/21  Tommi Rumps, PA-C  hydrOXYzine (ATARAX/VISTARIL) 25 MG tablet Take 1 tablet (25 mg total) by mouth 3 (three) times daily as needed. 01/15/21   Joni Reining, PA-C  IRON PO Take 1 capsule by mouth daily. Ortho Molecular Reacted Iron    [provider]  predniSONE (STERAPRED UNI-PAK 21 TAB) 10 MG (21) TBPK tablet Take by mouth daily. As directed Patient not taking: Reported on 05/09/2023 04/03/22   Mickie Bail, NP  Triamcinolone Acetonide (NASACORT ALLERGY 24HR NA) Place 1 spray into the nose daily as needed.    [provider]    Family History Family History  Problem Relation Age of Onset   Positive PPD/TB Exposure Neg Hx    Cancer Mother        breast - left dx at 91, 2nd breast cancer diagnosis at 68 (? new primary); and melanoma on right arm at 68.   Breast cancer Mother    Hyperlipidemia Mother    Depression Mother    Fibroids Mother    Hypertension  Brother    Hyperlipidemia Brother    Cancer Maternal Grandmother        colon cancer at age 74; and melanoma on left arm at age ?; reportedly told she has Lynch syndrome (no report available today)   Arthritis Maternal Grandmother    Hyperlipidemia Maternal Grandmother    Hypertension Maternal Grandmother    Heart disease Maternal Grandmother    Heart attack Maternal Grandmother    Osteoporosis Maternal Grandmother    Alcohol abuse Maternal Aunt    Alcohol abuse Maternal Grandfather    Depression Father     Social History Social History   Tobacco Use   Smoking status: Never   Smokeless tobacco: Never  Substance Use Topics   Alcohol use: Yes    Alcohol/week: 0.0 standard drinks of alcohol    Comment: drinks once a month   Drug use: No     Allergies   Ranitidine hcl, Clarithromycin, Percocet [oxycodone-acetaminophen], Sulfa antibiotics, Vicodin [hydrocodone-acetaminophen], and Zithromax [azithromycin]   Review of Systems Review of Systems  Constitutional:  Negative for chills and fever.  HENT:  Positive for congestion, postnasal drip, rhinorrhea, sinus pressure and sinus pain. Negative for ear pain and sore throat.   Respiratory:  Positive for cough and shortness of breath.   Cardiovascular:  Negative for chest pain and palpitations.     Physical Exam Triage Vital Signs ED Triage Vitals [05/09/23 1840]  Encounter Vitals Group     BP      Systolic BP Percentile      Diastolic BP Percentile      Pulse      Resp      Temp      Temp src      SpO2      Weight      Height      Head Circumference      Peak Flow      Pain Score 7     Pain Loc      Pain Education      Exclude from Growth Chart    No data found.  Updated Vital Signs BP 131/82   Pulse 74   Temp (!) 97.5 F (36.4 C)   Resp 18   SpO2 98%   Visual Acuity Right Eye Distance:   Left Eye Distance:   Bilateral Distance:    Right Eye Near:   Left Eye  Near:    Bilateral Near:     Physical  Exam Constitutional:      General: She is not in acute distress. HENT:     Right Ear: Tympanic membrane normal.     Left Ear: Tympanic membrane normal.     Nose: Congestion and rhinorrhea present.     Mouth/Throat:     Mouth: Mucous membranes are moist.     Pharynx: Oropharynx is clear.  Cardiovascular:     Rate and Rhythm: Normal rate and regular rhythm.     Heart sounds: Normal heart sounds.  Pulmonary:     Effort: Pulmonary effort is normal. No respiratory distress.     Breath sounds: Rhonchi present.     Comments: Rhonchi in upper airway which clear with coughing. Skin:    General: Skin is warm and dry.  Neurological:     Mental Status: She is alert.      UC Treatments / Results  Labs (all labs ordered are listed, but only abnormal results are displayed) Labs Reviewed - No data to display  EKG   Radiology No results found.  Procedures Procedures (including critical care time)  Medications Ordered in UC Medications - No data to display  Initial Impression / Assessment and Plan / UC Course  I have reviewed the triage vital signs and the nursing notes.  Pertinent labs & imaging results that were available during my care of the patient were reviewed by me and considered in my medical decision making (see chart for details).    Acute upper respiratory infection, acute sinusitis.  Afebrile and vital signs are stable.  No respiratory distress, O2 sat 98% on room air.  Treating with doxycycline.  Instructed patient to follow-up with her PCP if she is not improving.  Education provided on acute URI and sinusitis.  Patient agrees to plan of care.  Final Clinical Impressions(s) / UC Diagnoses   Final diagnoses:  Acute upper respiratory infection  Acute non-recurrent maxillary sinusitis     Discharge Instructions      Take the doxycycline as directed.  Follow-up with your primary care provider if your symptoms are not improving.      ED Prescriptions      Medication Sig Dispense Auth. Provider   doxycycline (VIBRAMYCIN) 100 MG capsule Take 1 capsule (100 mg total) by mouth 2 (two) times daily. 20 capsule Mickie Bail, NP      PDMP not reviewed this encounter.   Mickie Bail, NP 05/09/23 1905

## 2023-11-13 ENCOUNTER — Ambulatory Visit
Admission: EM | Admit: 2023-11-13 | Discharge: 2023-11-13 | Disposition: A | Discharge status: Home / Self Care | Attending: Emergency Medicine | Admitting: Emergency Medicine

## 2023-11-13 ENCOUNTER — Encounter: Payer: Self-pay | Admitting: Emergency Medicine

## 2023-11-13 DIAGNOSIS — R21 Rash and other nonspecific skin eruption: Secondary | ICD-10-CM

## 2023-11-13 DIAGNOSIS — L559 Sunburn, unspecified: Secondary | ICD-10-CM

## 2023-11-13 MED ORDER — PREDNISONE 10 MG (21) PO TBPK: ORAL_TABLET | Freq: Every day | ORAL | 0 refills | Status: DC

## 2023-11-13 NOTE — ED Triage Notes (Signed)
 Patient reports that she got to much sun and now is having a reaction to it. Patient reports hives all over body x 1 day with itching.  Patient reports she has not taken anything for it. Patient denies pain at this time.

## 2023-11-13 NOTE — Discharge Instructions (Addendum)
Take Zyrtec and prednisone as directed.  Follow up with your primary care provider if your symptoms are not improving.

## 2023-11-13 NOTE — ED Provider Notes (Signed)
 Arlander Bellman    CSN: [id number] Arrival date & time: 11/13/23  4098      History   Chief Complaint Chief Complaint  Patient presents with   Rash    HPI SHAINDEL SWEETEN is a 53 y.o. female.  Patient presents with rash on trunk and extremities x 1 day.  The rash developed after she got sun exposure 2 days ago.  She reports similar rash occurring after sun exposure in the past.  No difficulty swallowing or breathing.  No OTC medications taken.  No fever, chills, shortness of breath.  The history is provided by the patient and medical records.    Past Medical History:  Diagnosis Date   Anemia    Anxiety    Arthritis    Back pain    Cancer (HCC) 2002   melanoma/basal cell   Dyspareunia    H/O bladder infections    Hemorrhoids    History of depression    History of kidney stones     Patient Active Problem List   Diagnosis Date Noted   Recurrent vaginitis 07/18/2016   Vaginal discharge 10/05/2015   Overweight 10/05/2015   Pain, joint, multiple sites 10/05/2014   Idiopathic hypersomnolence 10/05/2014   Snoring 09/18/2014   B12 deficiency 09/18/2014   Melanoma of skin (HCC) 03/12/2014   Family history of breast cancer 03/12/2014   Family history of colon cancer 03/12/2014   Family history of Lynch syndrome 02/18/2014   Low TSH level 10/09/2013   Menorrhagia 10/03/2013   Fatigue 10/03/2013   Adjustment disorder with mixed anxiety and depressed mood 10/03/2013   Exercise intolerance 10/03/2013   Seborrheic dermatitis 07/21/2013   Hair loss 07/21/2013   History of neck surgery 03/16/2013   Papule 03/14/2013   Vitamin D  deficiency 02/17/2013   Spinal stenosis in cervical region 02/17/2013   DDD (degenerative disc disease), cervical 07/26/2012   Family history of breast cancer in mother 07/24/2012    Past Surgical History:  Procedure Laterality Date   CERVICAL SPINE SURGERY  02/2013   CESAREAN SECTION  2005   FOOT SURGERY  2001    OB History      Gravida  5   Para  5   Term  5   Preterm      AB      Living  5      SAB      IAB      Ectopic      Multiple      Live Births               Home Medications    Prior to Admission medications   Medication Sig Start Date End Date Taking? Authorizing Provider  predniSONE  (STERAPRED UNI-PAK 21 TAB) 10 MG (21) TBPK tablet Take by mouth daily. As directed 11/13/23  Yes Wellington Half, NP  AMBULATORY NON FORMULARY MEDICATION Boric Acid Suppository 600 mg Insert 1 PV daily x 21 days 07/18/16   Herby Lolling E, MD  Cholecalciferol (HM VITAMIN D3) 4000 UNITS CAPS Take 1 capsule by mouth daily.    [provider]  clindamycin  (CLEOCIN ) 300 MG capsule Take 1 capsule (300 mg total) by mouth 2 (two) times daily. 07/18/16   Bedsole, Amy E, MD  Cyanocobalamin  5000 MCG CAPS Take by mouth every 3 (three) days.    [provider]  doxycycline  (VIBRAMYCIN ) 100 MG capsule Take 1 capsule (100 mg total) by mouth 2 (two) times daily. 05/09/23  Wellington Half, NP  famotidine  (PEPCID ) 20 MG tablet Take 1 tablet (20 mg total) by mouth 2 (two) times daily. 12/29/20 12/29/21  Stafford Eagles, PA-C  hydrOXYzine  (ATARAX /VISTARIL ) 25 MG tablet Take 1 tablet (25 mg total) by mouth 3 (three) times daily as needed. 01/15/21   Marcina Severe, PA-C  IRON PO Take 1 capsule by mouth daily. Ortho Molecular Reacted Iron    [provider]  Triamcinolone Acetonide (NASACORT ALLERGY 24HR NA) Place 1 spray into the nose daily as needed.    [provider]    Family History Family History  Problem Relation Age of Onset   Positive PPD/TB Exposure Neg Hx    Cancer Mother        breast - left dx at 76, 2nd breast cancer diagnosis at 60 (? new primary); and melanoma on right arm at 23.   Breast cancer Mother    Hyperlipidemia Mother    Depression Mother    Fibroids Mother    Hypertension Brother    Hyperlipidemia Brother    Cancer Maternal Grandmother        colon cancer at  age 57; and melanoma on left arm at age ?; reportedly told she has Lynch syndrome (no report available today)   Arthritis Maternal Grandmother    Hyperlipidemia Maternal Grandmother    Hypertension Maternal Grandmother    Heart disease Maternal Grandmother    Heart attack Maternal Grandmother    Osteoporosis Maternal Grandmother    Alcohol abuse Maternal Aunt    Alcohol abuse Maternal Grandfather    Depression Father     Social History Social History   Tobacco Use   Smoking status: Never   Smokeless tobacco: Never  Vaping Use   Vaping status: Never Used  Substance Use Topics   Alcohol use: Not Currently    Comment: drinks once a month   Drug use: No     Allergies   Ranitidine hcl, Clarithromycin, Percocet [oxycodone-acetaminophen ], Sulfa antibiotics, Vicodin [hydrocodone -acetaminophen ], and Zithromax  [azithromycin ]   Review of Systems Review of Systems  Constitutional:  Negative for chills and fever.  HENT:  Negative for sore throat, trouble swallowing and voice change.   Respiratory:  Negative for cough and shortness of breath.   Skin:  Positive for color change and rash.     Physical Exam Triage Vital Signs ED Triage Vitals  Encounter Vitals Group     BP 11/13/23 0842 100/63     Systolic BP Percentile --      Diastolic BP Percentile --      Pulse Rate 11/13/23 0842 77     Resp 11/13/23 0842 18     Temp 11/13/23 0842 98.1 F (36.7 C)     Temp Source 11/13/23 0842 Oral     SpO2 11/13/23 0842 100 %     Weight --      Height --      Head Circumference --      Peak Flow --      Pain Score 11/13/23 0838 0     Pain Loc --      Pain Education --      Exclude from Growth Chart --    No data found.  Updated Vital Signs BP 100/63 (BP Location: Left Arm)   Pulse 77   Temp 98.1 F (36.7 C) (Oral)   Resp 18   SpO2 100%   Visual Acuity Right Eye Distance:   Left Eye Distance:   Bilateral Distance:  Right Eye Near:   Left Eye Near:    Bilateral  Near:     Physical Exam Constitutional:      General: She is not in acute distress. HENT:     Mouth/Throat:     Mouth: Mucous membranes are moist.  Cardiovascular:     Rate and Rhythm: Normal rate and regular rhythm.  Pulmonary:     Effort: Pulmonary effort is normal. No respiratory distress.  Skin:    General: Skin is warm and dry.     Findings: Rash present.     Comments: Widespread erythematous rash on trunk and extremities with some hive-like areas.  No wounds or drainage.  Neurological:     Mental Status: She is alert.      UC Treatments / Results  Labs (all labs ordered are listed, but only abnormal results are displayed) Labs Reviewed - No data to display  EKG   Radiology No results found.  Procedures Procedures (including critical care time)  Medications Ordered in UC Medications - No data to display  Initial Impression / Assessment and Plan / UC Course  I have reviewed the triage vital signs and the nursing notes.  Pertinent labs & imaging results that were available during my care of the patient were reviewed by me and considered in my medical decision making (see chart for details).    Rash, sunburn.  Patient has widespread sunburn and hive-like rash on her trunk and extremities.  No difficulty swallowing or breathing.  Afebrile and vital signs are stable.  Treating today with Zyrtec and prednisone .  Discussed topical sunburn treatment also.  Education provided on rash and sun sensitivity.  Instructed patient to follow-up with her PCP if she is not improving.  She agrees to plan of care.  Final Clinical Impressions(s) / UC Diagnoses   Final diagnoses:  Rash  Sunburn     Discharge Instructions      Take Zyrtec and prednisone  as directed.  Follow-up with your primary care provider if your symptoms are not improving.    ED Prescriptions     Medication Sig Dispense Auth. Provider   predniSONE  (STERAPRED UNI-PAK 21 TAB) 10 MG (21) TBPK tablet Take  by mouth daily. As directed 21 tablet Wellington Half, NP      PDMP not reviewed this encounter.   Wellington Half, NP 11/13/23 [id number]

## 2023-12-27 NOTE — Progress Notes (Signed)
 This encounter was created in error - please disregard.

## 2024-01-03 ENCOUNTER — Ambulatory Visit
Admission: RE | Admit: 2024-01-03 | Discharge: 2024-01-03 | Disposition: A | Discharge status: Home / Self Care | Attending: Emergency Medicine | Admitting: Emergency Medicine

## 2024-01-03 DIAGNOSIS — R350 Frequency of micturition: Secondary | ICD-10-CM | POA: Diagnosis not present

## 2024-01-03 LAB — POCT URINE DIPSTICK
Bilirubin, UA: NEGATIVE
Glucose, UA: NEGATIVE mg/dL
Ketones, POC UA: NEGATIVE mg/dL
Nitrite, UA: NEGATIVE
POC PROTEIN,UA: NEGATIVE
Spec Grav, UA: 1.025 (ref 1.010–1.025)
Urobilinogen, UA: 0.2 U/dL
pH, UA: 6 (ref 5.0–8.0)

## 2024-01-03 MED ORDER — NITROFURANTOIN MONOHYD MACRO 100 MG PO CAPS: 100.0000 mg | ORAL_CAPSULE | Freq: Two times a day (BID) | ORAL | 0 refills | Status: AC

## 2024-01-03 NOTE — Discharge Instructions (Addendum)
 Your urinalysis shows Adrienne Reese blood cells but at this time does not show bacteria, your urine will be sent to the lab to determine exactly which bacteria is present, if any changes need to be made to your medications you will be notified  Begin use of Macrobid  twice daily for 5 days  You may use over-the-counter Azo to help minimize your symptoms until antibiotic removes bacteria, this medication will turn your urine orange  Increase your fluid intake through use of water  As always practice good hygiene, wiping front to back and avoidance of scented vaginal products to prevent further irritation  If symptoms continue to persist after use of medication or recur please follow-up with urgent care or your primary doctor as needed

## 2024-01-03 NOTE — ED Triage Notes (Signed)
 Patient to Urgent Care with complaints of urinary frequency/ dysuria/ urgency. Symptoms x10 days. Denies any vaginal discharge.  Attempted using oil of oregano- woke up this morning feeling much worse.

## 2024-01-03 NOTE — ED Provider Notes (Signed)
 Adrienne Reese    CSN: [id number] Arrival date & time: 01/03/24  1258      History   Chief Complaint Chief Complaint  Patient presents with   Urinary Frequency    UTI - Entered by patient    HPI Adrienne Reese is a 53 y.o. female.   Patient presents for evaluation of urinary frequency, urgency and dysuria beginning 10 days ago.  Has attempted use of all of her regular which has been helpful.  Denies hematuria, abdominal or flank pain, fever or vaginal symptoms.  Currently spotting, history of an ablation.  Past Medical History:  Diagnosis Date   Anemia    Anxiety    Arthritis    Back pain    Cancer (HCC) 2002   melanoma/basal cell   Dyspareunia    H/O bladder infections    Hemorrhoids    History of depression    History of kidney stones     Patient Active Problem List   Diagnosis Date Noted   Recurrent vaginitis 07/18/2016   Vaginal discharge 10/05/2015   Overweight 10/05/2015   Pain, joint, multiple sites 10/05/2014   Idiopathic hypersomnolence 10/05/2014   Snoring 09/18/2014   B12 deficiency 09/18/2014   Melanoma of skin (HCC) 03/12/2014   Family history of breast cancer 03/12/2014   Family history of colon cancer 03/12/2014   Family history of Lynch syndrome 02/18/2014   Low TSH level 10/09/2013   Menorrhagia 10/03/2013   Fatigue 10/03/2013   Adjustment disorder with mixed anxiety and depressed mood 10/03/2013   Exercise intolerance 10/03/2013   Seborrheic dermatitis 07/21/2013   Hair loss 07/21/2013   History of neck surgery 03/16/2013   Papule 03/14/2013   Vitamin D  deficiency 02/17/2013   Spinal stenosis in cervical region 02/17/2013   DDD (degenerative disc disease), cervical 07/26/2012   Family history of breast cancer in mother 07/24/2012    Past Surgical History:  Procedure Laterality Date   CERVICAL SPINE SURGERY  02/2013   CESAREAN SECTION  2005   FOOT SURGERY  2001    OB History     Gravida  5   Para  5   Term  5    Preterm      AB      Living  5      SAB      IAB      Ectopic      Multiple      Live Births               Home Medications    Prior to Admission medications   Medication Sig Start Date End Date Taking? Authorizing Provider  ASHWAGANDHA PO Take by mouth.   Yes [provider]  GLUTATHIONE PO Take by mouth.   Yes [provider]  AMBULATORY NON FORMULARY MEDICATION Boric Acid Suppository 600 mg Insert 1 PV daily x 21 days 07/18/16   Bedsole, Amy E, MD  Cholecalciferol (HM VITAMIN D3) 4000 UNITS CAPS Take 1 capsule by mouth daily.    [provider]  clindamycin  (CLEOCIN ) 300 MG capsule Take 1 capsule (300 mg total) by mouth 2 (two) times daily. 07/18/16   Bedsole, Amy E, MD  Cyanocobalamin  5000 MCG CAPS Take by mouth every 3 (three) days. Patient not taking: Reported on 01/03/2024    [provider]  doxycycline  (VIBRAMYCIN ) 100 MG capsule Take 1 capsule (100 mg total) by mouth 2 (two) times daily. Patient not taking: Reported on 01/03/2024 05/09/23  Corlis Burnard DEL, NP  famotidine  (PEPCID ) 20 MG tablet Take 1 tablet (20 mg total) by mouth 2 (two) times daily. Patient not taking: Reported on 01/03/2024 12/29/20 12/29/21  Saunders Shona CROME, PA-C  hydrOXYzine  (ATARAX /VISTARIL ) 25 MG tablet Take 1 tablet (25 mg total) by mouth 3 (three) times daily as needed. Patient not taking: Reported on 01/03/2024 01/15/21   Claudene Tanda POUR, PA-C  IRON PO Take 1 capsule by mouth daily. Ortho Molecular Reacted Iron    [provider]  nitrofurantoin , macrocrystal-monohydrate, (MACROBID ) 100 MG capsule Take 1 capsule (100 mg total) by mouth 2 (two) times daily. 01/03/24  Yes Lois Slagel R, NP  predniSONE  (STERAPRED UNI-PAK 21 TAB) 10 MG (21) TBPK tablet Take by mouth daily. As directed Patient not taking: Reported on 01/03/2024 11/13/23   Corlis Burnard DEL, NP  Triamcinolone Acetonide (NASACORT ALLERGY 24HR NA) Place 1 spray into the nose daily as needed.     [provider]    Family History Family History  Problem Relation Age of Onset   Positive PPD/TB Exposure Neg Hx    Cancer Mother        breast - left dx at 61, 2nd breast cancer diagnosis at 49 (? new primary); and melanoma on right arm at 53.   Breast cancer Mother    Hyperlipidemia Mother    Depression Mother    Fibroids Mother    Hypertension Brother    Hyperlipidemia Brother    Cancer Maternal Grandmother        colon cancer at age 63; and melanoma on left arm at age ?; reportedly told she has Lynch syndrome (no report available today)   Arthritis Maternal Grandmother    Hyperlipidemia Maternal Grandmother    Hypertension Maternal Grandmother    Heart disease Maternal Grandmother    Heart attack Maternal Grandmother    Osteoporosis Maternal Grandmother    Alcohol abuse Maternal Aunt    Alcohol abuse Maternal Grandfather    Depression Father     Social History Social History   Tobacco Use   Smoking status: Never   Smokeless tobacco: Never  Vaping Use   Vaping status: Never Used  Substance Use Topics   Alcohol use: Not Currently    Comment: drinks once a month   Drug use: No     Allergies   Ranitidine hcl, Clarithromycin, Percocet [oxycodone-acetaminophen ], Sulfa antibiotics, Vicodin [hydrocodone -acetaminophen ], and Zithromax  [azithromycin ]   Review of Systems Review of Systems   Physical Exam Triage Vital Signs ED Triage Vitals [01/03/24 1309]  Encounter Vitals Group     BP      Girls Systolic BP Percentile      Girls Diastolic BP Percentile      Boys Systolic BP Percentile      Boys Diastolic BP Percentile      Pulse      Resp      Temp      Temp src      SpO2      Weight      Height      Head Circumference      Peak Flow      Pain Score 0     Pain Loc      Pain Education      Exclude from Growth Chart    No data found.  Updated Vital Signs There were no vitals taken for this visit.  Visual Acuity Right Eye Distance:    Left Eye Distance:   Bilateral  Distance:    Right Eye Near:   Left Eye Near:    Bilateral Near:     Physical Exam Constitutional:      Appearance: Normal appearance.  Eyes:     Extraocular Movements: Extraocular movements intact.  Pulmonary:     Effort: Pulmonary effort is normal.  Abdominal:     Tenderness: There is no abdominal tenderness. There is no right CVA tenderness, left CVA tenderness or guarding.  Neurological:     Mental Status: She is alert and oriented to person, place, and time. Mental status is at baseline.      UC Treatments / Results  Labs (all labs ordered are listed, but only abnormal results are displayed) Labs Reviewed  POCT URINE DIPSTICK - Abnormal; Notable for the following components:      Result Value   Blood, UA small (*)    Leukocytes, UA Moderate (2+) (*)    All other components within normal limits  URINE CULTURE    EKG   Radiology No results found.  Procedures Procedures (including critical care time)  Medications Ordered in UC Medications - No data to display  Initial Impression / Assessment and Plan / UC Course  I have reviewed the triage vital signs and the nursing notes.  Pertinent labs & imaging results that were available during my care of the patient were reviewed by me and considered in my medical decision making (see chart for details).  Urinary frequency  Urinalysis showing leukocytes, negative for nitrates, sent for culture, per chart review last culture available shows E. coli initiating antibiotics based on results, prescribed Macrobid  and discussed administration recommended over-the-counter medications and nonpharmacological supportive care, may follow-up with the urgent care if symptoms persist, worsen or recur Final Clinical Impressions(s) / UC Diagnoses   Final diagnoses:  Urinary frequency     Discharge Instructions      Your urinalysis shows Anahid Eskelson blood cells but at this time does not show bacteria,  your urine will be sent to the lab to determine exactly which bacteria is present, if any changes need to be made to your medications you will be notified  Begin use of Macrobid  twice daily for 5 days  You may use over-the-counter Azo to help minimize your symptoms until antibiotic removes bacteria, this medication will turn your urine orange  Increase your fluid intake through use of water  As always practice good hygiene, wiping front to back and avoidance of scented vaginal products to prevent further irritation  If symptoms continue to persist after use of medication or recur please follow-up with urgent care or your primary doctor as needed    ED Prescriptions     Medication Sig Dispense Auth. Provider   nitrofurantoin , macrocrystal-monohydrate, (MACROBID ) 100 MG capsule Take 1 capsule (100 mg total) by mouth 2 (two) times daily. 10 capsule Kaeden Mester R, NP      PDMP not reviewed this encounter.   Teresa Shelba SAUNDERS, NP 01/03/24 1321

## 2024-01-05 LAB — URINE CULTURE: Culture: 20000 — AB

## 2024-01-07 ENCOUNTER — Ambulatory Visit (HOSPITAL_COMMUNITY): Payer: Self-pay

## 2024-05-18 ENCOUNTER — Other Ambulatory Visit: Payer: Self-pay

## 2024-05-18 ENCOUNTER — Ambulatory Visit
Admission: EM | Admit: 2024-05-18 | Discharge: 2024-05-18 | Disposition: A | Discharge status: Home / Self Care | Payer: Self-pay

## 2024-05-18 DIAGNOSIS — J069 Acute upper respiratory infection, unspecified: Secondary | ICD-10-CM

## 2024-05-18 DIAGNOSIS — L509 Urticaria, unspecified: Secondary | ICD-10-CM

## 2024-05-18 DIAGNOSIS — R062 Wheezing: Secondary | ICD-10-CM

## 2024-05-18 MED ORDER — PREDNISONE 10 MG (21) PO TBPK: ORAL_TABLET | Freq: Every day | ORAL | 0 refills | Status: DC

## 2024-05-18 MED ORDER — ALBUTEROL SULFATE HFA 108 (90 BASE) MCG/ACT IN AERS: 1.0000 | INHALATION_SPRAY | Freq: Four times a day (QID) | RESPIRATORY_TRACT | 0 refills | Status: AC | PRN

## 2024-05-18 NOTE — ED Triage Notes (Signed)
 Pt being seen in UC for productive cough, body aches, and rash that is generalized throughout body since Wednesday. Pt reports itching. Pt denies fevers and v/d. Pt reports son has been sick. Pt reports taking tylenol  for body aches and benadryl for rash with no relief.

## 2024-05-18 NOTE — ED Provider Notes (Signed)
 [name]    CSN: [id number] Arrival date & time: 05/18/24  0847      History   Chief Complaint Chief Complaint  Patient presents with   Cough   Generalized Body Aches   Rash    HPI Adrienne Reese is a 53 y.o. female.  Patient presents with 4-day history of congestion, cough, body aches, rash.  Patient has pruritic hive-like rash on trunk and extremities.  She has been taking Tylenol  and Benadryl.  No fever, sore throat, difficulty swallowing, shortness of breath.  The history is provided by the patient and medical records.    Past Medical History:  Diagnosis Date   Anemia    Anxiety    Arthritis    Back pain    Cancer (HCC) 2002   melanoma/basal cell   Dyspareunia    H/O bladder infections    Hemorrhoids    History of depression    History of kidney stones     Patient Active Problem List   Diagnosis Date Noted   Recurrent vaginitis 07/18/2016   Vaginal discharge 10/05/2015   Overweight 10/05/2015   Pain, joint, multiple sites 10/05/2014   Idiopathic hypersomnolence 10/05/2014   Snoring 09/18/2014   B12 deficiency 09/18/2014   Melanoma of skin (HCC) 03/12/2014   Family history of breast cancer 03/12/2014   Family history of colon cancer 03/12/2014   Family history of Lynch syndrome 02/18/2014   Low TSH level 10/09/2013   Menorrhagia 10/03/2013   Fatigue 10/03/2013   Adjustment disorder with mixed anxiety and depressed mood 10/03/2013   Exercise intolerance 10/03/2013   Seborrheic dermatitis 07/21/2013   Hair loss 07/21/2013   History of neck surgery 03/16/2013   Papule 03/14/2013   Vitamin D  deficiency 02/17/2013   Spinal stenosis in cervical region 02/17/2013   DDD (degenerative disc disease), cervical 07/26/2012   Family history of breast cancer in mother 07/24/2012    Past Surgical History:  Procedure Laterality Date   CERVICAL SPINE SURGERY  02/2013   CESAREAN SECTION  2005   FOOT SURGERY  2001    OB History     Gravida   5   Para  5   Term  5   Preterm      AB      Living  5      SAB      IAB      Ectopic      Multiple      Live Births               Home Medications    Prior to Admission medications  Medication Sig Start Date End Date Taking? Authorizing Provider  albuterol  (VENTOLIN  HFA) 108 (90 Base) MCG/ACT inhaler Inhale 1-2 puffs into the lungs every 6 (six) hours as needed. 05/18/24  Yes Corlis Burnard DEL, NP  BORON PO Take by mouth.   Yes [provider]  predniSONE  (STERAPRED UNI-PAK 21 TAB) 10 MG (21) TBPK tablet Take by mouth daily. As directed 05/18/24  Yes Corlis Burnard DEL, NP  AMBULATORY NON FORMULARY MEDICATION Boric Acid Suppository 600 mg Insert 1 PV daily x 21 days 07/18/16   Avelina Greig BRAVO, MD  ASHWAGANDHA PO Take by mouth.    [provider]  Cholecalciferol (HM VITAMIN D3) 4000 UNITS CAPS Take 1 capsule by mouth daily.    [provider]  clindamycin  (CLEOCIN ) 300 MG capsule Take 1 capsule (300 mg total) by mouth 2 (two) times daily. 07/18/16  Avelina Greig BRAVO, MD  Collagen-Vitamin C-Biotin (COLLAGEN) 500-50-0.8 MG CAPS as directed Externally    [provider]  Cyanocobalamin  5000 MCG CAPS Take by mouth every 3 (three) days. Patient not taking: Reported on 01/03/2024    [provider]  doxycycline  (VIBRAMYCIN ) 100 MG capsule Take 1 capsule (100 mg total) by mouth 2 (two) times daily. Patient not taking: Reported on 01/03/2024 05/09/23   Corlis Burnard DEL, NP  famotidine  (PEPCID ) 20 MG tablet Take 1 tablet (20 mg total) by mouth 2 (two) times daily. Patient not taking: Reported on 01/03/2024 12/29/20 12/29/21  Saunders Shona CROME, PA-C  GLUTATHIONE PO Take by mouth.    [provider]  hydrOXYzine  (ATARAX /VISTARIL ) 25 MG tablet Take 1 tablet (25 mg total) by mouth 3 (three) times daily as needed. Patient not taking: Reported on 01/03/2024 01/15/21   Claudene Tanda POUR, PA-C  IRON PO Take 1 capsule by mouth daily. Ortho Molecular  Reacted Iron    [provider]  L-Lysine 1000 MG TABS as directed Orally daily    [provider]  nitrofurantoin , macrocrystal-monohydrate, (MACROBID ) 100 MG capsule Take 1 capsule (100 mg total) by mouth 2 (two) times daily. Patient not taking: Reported on 05/18/2024 01/03/24   Teresa Shelba SAUNDERS, NP  Triamcinolone Acetonide (NASACORT ALLERGY 24HR NA) Place 1 spray into the nose daily as needed.    [provider]    Family History Family History  Problem Relation Age of Onset   Positive PPD/TB Exposure Neg Hx    Cancer Mother        breast - left dx at 44, 2nd breast cancer diagnosis at 71 (? new primary); and melanoma on right arm at 65.   Breast cancer Mother    Hyperlipidemia Mother    Depression Mother    Fibroids Mother    Hypertension Brother    Hyperlipidemia Brother    Cancer Maternal Grandmother        colon cancer at age 24; and melanoma on left arm at age ?; reportedly told she has Lynch syndrome (no report available today)   Arthritis Maternal Grandmother    Hyperlipidemia Maternal Grandmother    Hypertension Maternal Grandmother    Heart disease Maternal Grandmother    Heart attack Maternal Grandmother    Osteoporosis Maternal Grandmother    Alcohol abuse Maternal Aunt    Alcohol abuse Maternal Grandfather    Depression Father     Social History Social History[1]   Allergies   Ranitidine hcl, Clarithromycin, Percocet [oxycodone-acetaminophen ], Sulfa antibiotics, Vicodin [hydrocodone -acetaminophen ], and Zithromax  [azithromycin ]   Review of Systems Review of Systems  Constitutional:  Negative for chills and fever.  HENT:  Positive for congestion. Negative for ear pain, sore throat, trouble swallowing and voice change.   Respiratory:  Positive for cough and wheezing. Negative for shortness of breath.   Skin:  Positive for color change and rash.     Physical Exam Triage Vital Signs ED Triage Vitals  Encounter Vitals Group      BP 05/18/24 0858 136/83     Girls Systolic BP Percentile --      Girls Diastolic BP Percentile --      Boys Systolic BP Percentile --      Boys Diastolic BP Percentile --      Pulse Rate 05/18/24 0858 (!) 119     Resp 05/18/24 0858 18     Temp 05/18/24 0858 98 F (36.7 C)     Temp src --  SpO2 05/18/24 0858 97 %     Weight --      Height --      Head Circumference --      Peak Flow --      Pain Score 05/18/24 0856 5     Pain Loc --      Pain Education --      Exclude from Growth Chart --    No data found.  Updated Vital Signs BP 136/83   Pulse (!) 119   Temp 98 F (36.7 C)   Resp 18   SpO2 97%   Visual Acuity Right Eye Distance:   Left Eye Distance:   Bilateral Distance:    Right Eye Near:   Left Eye Near:    Bilateral Near:     Physical Exam Constitutional:      General: She is not in acute distress. HENT:     Right Ear: Tympanic membrane normal.     Left Ear: Tympanic membrane normal.     Nose: Nose normal.     Mouth/Throat:     Mouth: Mucous membranes are moist.     Pharynx: Oropharynx is clear.  Cardiovascular:     Rate and Rhythm: Normal rate and regular rhythm.     Heart sounds: Normal heart sounds.  Pulmonary:     Effort: Pulmonary effort is normal. No respiratory distress.     Breath sounds: Wheezing and rhonchi present.     Comments: Scattered wheezes and rhonchi throughout. Neurological:     Mental Status: She is alert.      UC Treatments / Results  Labs (all labs ordered are listed, but only abnormal results are displayed) Labs Reviewed - No data to display  EKG   Radiology No results found.  Procedures Procedures (including critical care time)  Medications Ordered in UC Medications - No data to display  Initial Impression / Assessment and Plan / UC Course  I have reviewed the triage vital signs and the nursing notes.  Pertinent labs & imaging results that were available during my care of the patient were reviewed by me  and considered in my medical decision making (see chart for details).    Hives, acute upper respiratory infection, wheezing.  O2 sat 97% on room air.  Treating today with albuterol  inhaler, prednisone  taper, Zyrtec.  Education provided on hives and upper respiratory infection.  Patient does not currently have a PCP as she does not have health insurance.  She declines assistance with establishing PCP at this time.  ED precautions given.  She agrees to plan of care.  Final Clinical Impressions(s) / UC Diagnoses   Final diagnoses:  Hives  Acute upper respiratory infection  Wheezing     Discharge Instructions      Take Zyrtec and prednisone  as directed.  Use the albuterol  inhaler as directed.  Go to the emergency department if you have acute shortness of breath or worsening symptoms.     ED Prescriptions     Medication Sig Dispense Auth. Provider   albuterol  (VENTOLIN  HFA) 108 (90 Base) MCG/ACT inhaler Inhale 1-2 puffs into the lungs every 6 (six) hours as needed. 18 g Corlis Burnard DEL, NP   predniSONE  (STERAPRED UNI-PAK 21 TAB) 10 MG (21) TBPK tablet Take by mouth daily. As directed 21 tablet Corlis Burnard DEL, NP      PDMP not reviewed this encounter.    [1]  Social History Tobacco Use   Smoking status: Never   Smokeless tobacco:  Never  Vaping Use   Vaping status: Never Used  Substance Use Topics   Alcohol use: Not Currently    Comment: drinks once a month   Drug use: Not Currently    Types: Marijuana     Corlis Burnard DEL, NP 05/18/24 [phone]

## 2024-05-18 NOTE — Discharge Instructions (Addendum)
 Take Zyrtec and prednisone  as directed.  Use the albuterol  inhaler as directed.  Go to the emergency department if you have acute shortness of breath or worsening symptoms.

## 2024-06-12 ENCOUNTER — Ambulatory Visit
Admission: EM | Admit: 2024-06-12 | Discharge: 2024-06-12 | Disposition: A | Discharge status: Home / Self Care | Payer: Self-pay | Attending: Emergency Medicine | Admitting: Emergency Medicine

## 2024-06-12 ENCOUNTER — Ambulatory Visit (INDEPENDENT_AMBULATORY_CARE_PROVIDER_SITE_OTHER): Payer: Self-pay

## 2024-06-12 ENCOUNTER — Encounter: Payer: Self-pay | Admitting: Emergency Medicine

## 2024-06-12 DIAGNOSIS — R062 Wheezing: Secondary | ICD-10-CM

## 2024-06-12 DIAGNOSIS — J4521 Mild intermittent asthma with (acute) exacerbation: Secondary | ICD-10-CM

## 2024-06-12 MED ORDER — PREDNISONE 10 MG (21) PO TBPK: ORAL_TABLET | Freq: Every day | ORAL | 0 refills | Status: AC

## 2024-06-12 MED ORDER — DOXYCYCLINE HYCLATE 100 MG PO CAPS: 100.0000 mg | ORAL_CAPSULE | Freq: Two times a day (BID) | ORAL | 0 refills | Status: AC

## 2024-06-12 NOTE — Discharge Instructions (Addendum)
 Your evaluated for your persistent wheezing which is able to be heard on exam however you are getting enough air without assistance  Chest x-ray is pending and you will be notified of results via telephone  As symptoms have persisted for 5 weeks concerning for bacteria causing them to linger and therefore even started on antibiotics  Take doxycycline  twice daily for 7 days, take a form of probiotic to promote good gut health while taking medicine  Starting tomorrow take prednisone  every morning at directed with food to open and relax airway which should help to settle wheezing  Continue use of inhaler as directed  Any point if your breathing worsens in severity please go to the nearest emergency department for immediate evaluation

## 2024-06-12 NOTE — ED Triage Notes (Signed)
 Patient complains wheezing and chest congestion x  5 weeks. Patient has used albuterol  inhaler with no relief.

## 2024-06-12 NOTE — ED Provider Notes (Addendum)
 " [name]    CSN: [id number] Arrival date & time: 06/12/24  1715      History   Chief Complaint Chief Complaint  Patient presents with   Wheezing    HPI Adrienne Reese is a 54 y.o. female.   Patient presents for evaluation of persistent wheezing for 5 weeks.  Associated shortness of breath with exertion and a infrequent nonproductive cough.  Over the past 5 weeks is felt as if there was mucus within the throat that she is unable to clear and has had intermittent hoarseness.  Symptoms worsening today has began to experience a heaviness to the lower area of the chest, requiring more effort to take a deep breath and wheezing has worsened.  Was given albuterol  inhaler and has had to use more frequency endorsing at least 3 times per day.  Denies fever, ear pain or sore throat.  During this timeframe was evaluated in this urgent care for a rash and given prednisone  which she endorses did help with breathing during that timeframe.  Denies respiratory history, non-smoker.   Past Medical History:  Diagnosis Date   Anemia    Anxiety    Arthritis    Back pain    Cancer (HCC) 2002   melanoma/basal cell   Dyspareunia    H/O bladder infections    Hemorrhoids    History of depression    History of kidney stones     Patient Active Problem List   Diagnosis Date Noted   Recurrent vaginitis 07/18/2016   Vaginal discharge 10/05/2015   Overweight 10/05/2015   Pain, joint, multiple sites 10/05/2014   Idiopathic hypersomnolence 10/05/2014   Snoring 09/18/2014   B12 deficiency 09/18/2014   Melanoma of skin (HCC) 03/12/2014   Family history of breast cancer 03/12/2014   Family history of colon cancer 03/12/2014   Family history of Lynch syndrome 02/18/2014   Low TSH level 10/09/2013   Menorrhagia 10/03/2013   Fatigue 10/03/2013   Adjustment disorder with mixed anxiety and depressed mood 10/03/2013   Exercise intolerance 10/03/2013   Seborrheic dermatitis 07/21/2013    Hair loss 07/21/2013   History of neck surgery 03/16/2013   Papule 03/14/2013   Vitamin D  deficiency 02/17/2013   Spinal stenosis in cervical region 02/17/2013   DDD (degenerative disc disease), cervical 07/26/2012   Family history of breast cancer in mother 07/24/2012    Past Surgical History:  Procedure Laterality Date   CERVICAL SPINE SURGERY  02/2013   CESAREAN SECTION  2005   FOOT SURGERY  2001    OB History     Gravida  5   Para  5   Term  5   Preterm      AB      Living  5      SAB      IAB      Ectopic      Multiple      Live Births               Home Medications    Prior to Admission medications  Medication Sig Start Date End Date Taking? Authorizing Provider  albuterol  (VENTOLIN  HFA) 108 (90 Base) MCG/ACT inhaler Inhale 1-2 puffs into the lungs every 6 (six) hours as needed. 05/18/24   Corlis Burnard DEL, NP  AMBULATORY NON FORMULARY MEDICATION Boric Acid Suppository 600 mg Insert 1 PV daily x 21 days 07/18/16   Avelina Greig BRAVO, MD  ASHWAGANDHA PO Take by mouth.  [provider]  BORON PO Take by mouth.    [provider]  Cholecalciferol (HM VITAMIN D3) 4000 UNITS CAPS Take 1 capsule by mouth daily.    [provider]  clindamycin  (CLEOCIN ) 300 MG capsule Take 1 capsule (300 mg total) by mouth 2 (two) times daily. 07/18/16   Avelina Greig BRAVO, MD  Collagen-Vitamin C-Biotin (COLLAGEN) 500-50-0.8 MG CAPS as directed Externally    [provider]  Cyanocobalamin  5000 MCG CAPS Take by mouth every 3 (three) days. Patient not taking: Reported on 01/03/2024    [provider]  doxycycline  (VIBRAMYCIN ) 100 MG capsule Take 1 capsule (100 mg total) by mouth 2 (two) times daily. Patient not taking: Reported on 01/03/2024 05/09/23   Corlis Burnard DEL, NP  famotidine  (PEPCID ) 20 MG tablet Take 1 tablet (20 mg total) by mouth 2 (two) times daily. Patient not taking: Reported on 01/03/2024 12/29/20 12/29/21  Saunders Shona CROME, PA-C   GLUTATHIONE PO Take by mouth.    [provider]  hydrOXYzine  (ATARAX /VISTARIL ) 25 MG tablet Take 1 tablet (25 mg total) by mouth 3 (three) times daily as needed. Patient not taking: Reported on 01/03/2024 01/15/21   Claudene Tanda POUR, PA-C  IRON PO Take 1 capsule by mouth daily. Ortho Molecular Reacted Iron    [provider]  L-Lysine 1000 MG TABS as directed Orally daily    [provider]  nitrofurantoin , macrocrystal-monohydrate, (MACROBID ) 100 MG capsule Take 1 capsule (100 mg total) by mouth 2 (two) times daily. Patient not taking: Reported on 05/18/2024 01/03/24   Teresa Shelba SAUNDERS, NP  predniSONE  (STERAPRED UNI-PAK 21 TAB) 10 MG (21) TBPK tablet Take by mouth daily. As directed 05/18/24   Corlis Burnard DEL, NP  Triamcinolone Acetonide (NASACORT ALLERGY 24HR NA) Place 1 spray into the nose daily as needed.    [provider]    Family History Family History  Problem Relation Age of Onset   Positive PPD/TB Exposure Neg Hx    Cancer Mother        breast - left dx at 14, 2nd breast cancer diagnosis at 21 (? new primary); and melanoma on right arm at 56.   Breast cancer Mother    Hyperlipidemia Mother    Depression Mother    Fibroids Mother    Hypertension Brother    Hyperlipidemia Brother    Cancer Maternal Grandmother        colon cancer at age 19; and melanoma on left arm at age ?; reportedly told she has Lynch syndrome (no report available today)   Arthritis Maternal Grandmother    Hyperlipidemia Maternal Grandmother    Hypertension Maternal Grandmother    Heart disease Maternal Grandmother    Heart attack Maternal Grandmother    Osteoporosis Maternal Grandmother    Alcohol abuse Maternal Aunt    Alcohol abuse Maternal Grandfather    Depression Father     Social History Social History[1]   Allergies   Ranitidine hcl, Clarithromycin, Percocet [oxycodone-acetaminophen ], Sulfa antibiotics, Vicodin [hydrocodone -acetaminophen ], and Zithromax   [azithromycin ]   Review of Systems Review of Systems   Physical Exam Triage Vital Signs ED Triage Vitals  Encounter Vitals Group     BP 06/12/24 1756 111/64     Girls Systolic BP Percentile --      Girls Diastolic BP Percentile --      Boys Systolic BP Percentile --      Boys Diastolic BP Percentile --      Pulse Rate 06/12/24 1756  76     Resp 06/12/24 1756 18     Temp 06/12/24 1756 98.2 F (36.8 C)     Temp Source 06/12/24 1756 Oral     SpO2 06/12/24 1756 97 %     Weight --      Height --      Head Circumference --      Peak Flow --      Pain Score 06/12/24 1759 0     Pain Loc --      Pain Education --      Exclude from Growth Chart --    No data found.  Updated Vital Signs BP 111/64 (BP Location: Right Arm)   Pulse 76   Temp 98.2 F (36.8 C) (Oral)   Resp 18   SpO2 97%   Visual Acuity Right Eye Distance:   Left Eye Distance:   Bilateral Distance:    Right Eye Near:   Left Eye Near:    Bilateral Near:     Physical Exam Constitutional:      Appearance: Normal appearance.  Eyes:     Extraocular Movements: Extraocular movements intact.  Cardiovascular:     Rate and Rhythm: Normal rate and regular rhythm.     Pulses: Normal pulses.     Heart sounds: Normal heart sounds.  Pulmonary:     Effort: Pulmonary effort is normal.     Comments: Wheezing heard to the bilateral upper lobes, remaining lobes clear Neurological:     Mental Status: She is alert and oriented to person, place, and time.      UC Treatments / Results  Labs (all labs ordered are listed, but only abnormal results are displayed) Labs Reviewed - No data to display  EKG   Radiology No results found.  Procedures Procedures (including critical care time)  Medications Ordered in UC Medications - No data to display  Initial Impression / Assessment and Plan / UC Course  I have reviewed the triage vital signs and the nursing notes.  Pertinent labs & imaging results that were  available during my care of the patient were reviewed by me and considered in my medical decision making (see chart for details).  Wheezing  Vitals are stable, O2 saturation 97% on room air, wheezing present to auscultation and patient in no signs of distress nontoxic-appearing, stable for outpatient management, chest x-ray negative, reported the patient via telephone with 2 patient identifiers used prescribed doxycycline  and prednisone  recommended over-the-counter medications and nonpharmacological supportive care, advised continued use of inhaler with follow-up as needed Final Clinical Impressions(s) / UC Diagnoses   Final diagnoses:  None   Discharge Instructions   None    ED Prescriptions   None    PDMP not reviewed this encounter.    Teresa Shelba SAUNDERS, NP 06/12/24 1921     [1]  Social History Tobacco Use   Smoking status: Never   Smokeless tobacco: Never  Vaping Use   Vaping status: Never Used  Substance Use Topics   Alcohol use: Not Currently    Comment: drinks once a month   Drug use: Not Currently    Types: Marijuana     Teresa Shelba SAUNDERS, NP 06/12/24 1950  "
